# Patient Record
Sex: Female | Born: 1935 | Race: White | Hispanic: No | Marital: Married | Smoking: Never smoker
Health system: Southern US, Community
[De-identification: ages and names within clinical notes are randomized; demographics above are authoritative.]

## PROBLEM LIST (undated history)

## (undated) DIAGNOSIS — M545 Low back pain, unspecified: Secondary | ICD-10-CM

## (undated) DIAGNOSIS — E119 Type 2 diabetes mellitus without complications: Secondary | ICD-10-CM

## (undated) DIAGNOSIS — H3530 Unspecified macular degeneration: Secondary | ICD-10-CM

## (undated) DIAGNOSIS — I10 Essential (primary) hypertension: Secondary | ICD-10-CM

## (undated) DIAGNOSIS — M1990 Unspecified osteoarthritis, unspecified site: Secondary | ICD-10-CM

## (undated) DIAGNOSIS — R0989 Other specified symptoms and signs involving the circulatory and respiratory systems: Secondary | ICD-10-CM

## (undated) DIAGNOSIS — G8929 Other chronic pain: Secondary | ICD-10-CM

## (undated) HISTORY — DX: Low back pain: M54.5

## (undated) HISTORY — DX: Other specified symptoms and signs involving the circulatory and respiratory systems: R09.89

## (undated) HISTORY — DX: Low back pain, unspecified: M54.50

## (undated) HISTORY — DX: Unspecified macular degeneration: H35.30

## (undated) HISTORY — DX: Unspecified osteoarthritis, unspecified site: M19.90

## (undated) HISTORY — DX: Essential (primary) hypertension: I10

## (undated) HISTORY — DX: Other chronic pain: G89.29

## (undated) HISTORY — DX: Type 2 diabetes mellitus without complications: E11.9

---

## 2004-05-19 ENCOUNTER — Ambulatory Visit: Payer: Self-pay | Admitting: Family Medicine

## 2004-08-25 ENCOUNTER — Ambulatory Visit: Payer: Self-pay | Admitting: Family Medicine

## 2004-09-22 ENCOUNTER — Ambulatory Visit: Payer: Self-pay | Admitting: Family Medicine

## 2004-10-28 ENCOUNTER — Ambulatory Visit: Payer: Self-pay | Admitting: Family Medicine

## 2005-02-10 ENCOUNTER — Ambulatory Visit: Payer: Self-pay | Admitting: Family Medicine

## 2005-05-18 ENCOUNTER — Ambulatory Visit: Payer: Self-pay | Admitting: Family Medicine

## 2005-07-13 ENCOUNTER — Ambulatory Visit: Payer: Self-pay | Admitting: Family Medicine

## 2005-10-18 ENCOUNTER — Ambulatory Visit: Payer: Self-pay | Admitting: Family Medicine

## 2005-11-25 ENCOUNTER — Ambulatory Visit: Payer: Self-pay | Admitting: Family Medicine

## 2005-12-07 ENCOUNTER — Ambulatory Visit: Payer: Self-pay | Admitting: Family Medicine

## 2006-01-20 ENCOUNTER — Ambulatory Visit: Payer: Self-pay | Admitting: Family Medicine

## 2006-01-27 ENCOUNTER — Ambulatory Visit: Payer: Self-pay | Admitting: Family Medicine

## 2006-02-05 ENCOUNTER — Encounter: Admission: RE | Admit: 2006-02-05 | Discharge: 2006-02-05 | Payer: Self-pay | Admitting: Orthopaedic Surgery

## 2006-02-21 ENCOUNTER — Encounter: Admission: RE | Admit: 2006-02-21 | Discharge: 2006-03-08 | Payer: Self-pay | Admitting: Orthopaedic Surgery

## 2006-03-22 ENCOUNTER — Encounter: Admission: RE | Admit: 2006-03-22 | Discharge: 2006-03-22 | Payer: Self-pay | Admitting: Orthopaedic Surgery

## 2006-04-13 ENCOUNTER — Encounter: Admission: RE | Admit: 2006-04-13 | Discharge: 2006-04-13 | Payer: Self-pay | Admitting: Orthopaedic Surgery

## 2006-05-04 ENCOUNTER — Ambulatory Visit: Payer: Self-pay | Admitting: Family Medicine

## 2006-05-24 ENCOUNTER — Encounter: Admission: RE | Admit: 2006-05-24 | Discharge: 2006-05-24 | Payer: Self-pay | Admitting: Orthopaedic Surgery

## 2006-07-21 ENCOUNTER — Encounter: Admission: RE | Admit: 2006-07-21 | Discharge: 2006-07-21 | Payer: Self-pay | Admitting: Neurosurgery

## 2008-12-23 ENCOUNTER — Encounter: Admission: RE | Admit: 2008-12-23 | Discharge: 2008-12-23 | Payer: Self-pay | Admitting: Cardiology

## 2008-12-25 ENCOUNTER — Ambulatory Visit: Payer: Self-pay | Admitting: Vascular Surgery

## 2009-04-30 ENCOUNTER — Encounter: Admission: RE | Admit: 2009-04-30 | Discharge: 2009-04-30 | Payer: Self-pay | Admitting: Otolaryngology

## 2009-12-22 ENCOUNTER — Ambulatory Visit: Payer: Self-pay | Admitting: Cardiology

## 2010-02-15 ENCOUNTER — Encounter: Payer: Self-pay | Admitting: Orthopaedic Surgery

## 2010-02-15 ENCOUNTER — Encounter: Payer: Self-pay | Admitting: Otolaryngology

## 2010-05-07 ENCOUNTER — Other Ambulatory Visit: Payer: Self-pay | Admitting: Otolaryngology

## 2010-05-07 DIAGNOSIS — E041 Nontoxic single thyroid nodule: Secondary | ICD-10-CM

## 2010-05-11 ENCOUNTER — Ambulatory Visit
Admission: RE | Admit: 2010-05-11 | Discharge: 2010-05-11 | Disposition: A | Discharge status: Home / Self Care | Payer: Medicare Other | Source: Ambulatory Visit | Attending: Otolaryngology | Admitting: Otolaryngology

## 2010-05-11 DIAGNOSIS — E041 Nontoxic single thyroid nodule: Secondary | ICD-10-CM

## 2010-06-09 NOTE — Procedures (Signed)
CAROTID DUPLEX EXAM   INDICATION:  Right bruit.   HISTORY:  Diabetes:  no  Cardiac:  no  Hypertension:  yes  Smoking:  no  Previous Surgery:  no  CV History:  Asymptomatic  Amaurosis Fugax No, Paresthesias No, Hemiparesis No                                       RIGHT             LEFT  Brachial systolic pressure:         150               144  Brachial Doppler waveforms:         triphasic         triphasic  Vertebral direction of flow:        Antegrade         Antegrade  DUPLEX VELOCITIES (cm/sec)  CCA peak systolic                   77                71  ECA peak systolic                   82                59  ICA peak systolic                   126               115  ICA end diastolic                   40                30  PLAQUE MORPHOLOGY:                  calcified         mixed  PLAQUE AMOUNT:                      Moderate          Moderate  PLAQUE LOCATION:                    Bifurcation, ICA, ECA               Bifurcation, ICA, ECA   IMPRESSION:  1. 40 to 59% stenosis noted in the bilateral internal carotid artery.  2. Mass noted at the level of the right proximal common carotid artery      measuring 1.12 x 1.38 cm.   ___________________________________________  Janetta Hora. Fields, MD   CJ/MEDQ  D:  12/25/2008  T:  12/25/2008  Job:  [id number]

## 2010-07-20 ENCOUNTER — Encounter: Payer: Self-pay | Admitting: Cardiology

## 2010-07-31 ENCOUNTER — Encounter: Payer: Self-pay | Admitting: Cardiology

## 2011-01-20 ENCOUNTER — Other Ambulatory Visit: Payer: Self-pay | Admitting: Cardiology

## 2011-05-26 ENCOUNTER — Encounter: Payer: Self-pay | Admitting: *Deleted

## 2013-05-22 ENCOUNTER — Inpatient Hospital Stay (HOSPITAL_COMMUNITY)
Admission: EM | Admit: 2013-05-22 | Discharge: 2013-05-25 | DRG: 871 | Disposition: A | Discharge status: Hospice | Payer: Medicare Other | Attending: Internal Medicine | Admitting: Internal Medicine

## 2013-05-22 ENCOUNTER — Emergency Department (HOSPITAL_COMMUNITY): Payer: Medicare Other

## 2013-05-22 ENCOUNTER — Encounter (HOSPITAL_COMMUNITY): Payer: Self-pay | Admitting: Emergency Medicine

## 2013-05-22 DIAGNOSIS — J918 Pleural effusion in other conditions classified elsewhere: Secondary | ICD-10-CM

## 2013-05-22 DIAGNOSIS — R0989 Other specified symptoms and signs involving the circulatory and respiratory systems: Secondary | ICD-10-CM | POA: Diagnosis present

## 2013-05-22 DIAGNOSIS — A419 Sepsis, unspecified organism: Principal | ICD-10-CM | POA: Diagnosis present

## 2013-05-22 DIAGNOSIS — Z515 Encounter for palliative care: Secondary | ICD-10-CM

## 2013-05-22 DIAGNOSIS — M1990 Unspecified osteoarthritis, unspecified site: Secondary | ICD-10-CM | POA: Diagnosis present

## 2013-05-22 DIAGNOSIS — M545 Low back pain, unspecified: Secondary | ICD-10-CM | POA: Diagnosis present

## 2013-05-22 DIAGNOSIS — I10 Essential (primary) hypertension: Secondary | ICD-10-CM | POA: Diagnosis present

## 2013-05-22 DIAGNOSIS — R6520 Severe sepsis without septic shock: Secondary | ICD-10-CM

## 2013-05-22 DIAGNOSIS — Z7982 Long term (current) use of aspirin: Secondary | ICD-10-CM

## 2013-05-22 DIAGNOSIS — J90 Pleural effusion, not elsewhere classified: Secondary | ICD-10-CM | POA: Diagnosis present

## 2013-05-22 DIAGNOSIS — IMO0002 Reserved for concepts with insufficient information to code with codable children: Secondary | ICD-10-CM

## 2013-05-22 DIAGNOSIS — H3530 Unspecified macular degeneration: Secondary | ICD-10-CM | POA: Diagnosis present

## 2013-05-22 DIAGNOSIS — R4182 Altered mental status, unspecified: Secondary | ICD-10-CM

## 2013-05-22 DIAGNOSIS — W19XXXA Unspecified fall, initial encounter: Secondary | ICD-10-CM | POA: Diagnosis present

## 2013-05-22 DIAGNOSIS — G8929 Other chronic pain: Secondary | ICD-10-CM | POA: Diagnosis present

## 2013-05-22 DIAGNOSIS — Z66 Do not resuscitate: Secondary | ICD-10-CM | POA: Diagnosis present

## 2013-05-22 DIAGNOSIS — R0600 Dyspnea, unspecified: Secondary | ICD-10-CM

## 2013-05-22 DIAGNOSIS — E119 Type 2 diabetes mellitus without complications: Secondary | ICD-10-CM | POA: Diagnosis present

## 2013-05-22 DIAGNOSIS — F411 Generalized anxiety disorder: Secondary | ICD-10-CM | POA: Diagnosis present

## 2013-05-22 DIAGNOSIS — J189 Pneumonia, unspecified organism: Secondary | ICD-10-CM | POA: Diagnosis present

## 2013-05-22 LAB — CBC WITH DIFFERENTIAL/PLATELET
BASOS PCT: 0 % (ref 0–1)
Basophils Absolute: 0 10*3/uL (ref 0.0–0.1)
EOS PCT: 0 % (ref 0–5)
Eosinophils Absolute: 0 10*3/uL (ref 0.0–0.7)
HCT: 31.9 % — ABNORMAL LOW (ref 36.0–46.0)
HEMOGLOBIN: 10.6 g/dL — AB (ref 12.0–15.0)
LYMPHS PCT: 3 % — AB (ref 12–46)
Lymphs Abs: 0.6 10*3/uL — ABNORMAL LOW (ref 0.7–4.0)
MCH: 30.3 pg (ref 26.0–34.0)
MCHC: 33.2 g/dL (ref 30.0–36.0)
MCV: 91.1 fL (ref 78.0–100.0)
MONO ABS: 1.6 10*3/uL — AB (ref 0.1–1.0)
Monocytes Relative: 7 % (ref 3–12)
NEUTROS PCT: 90 % — AB (ref 43–77)
Neutro Abs: 19.3 10*3/uL — ABNORMAL HIGH (ref 1.7–7.7)
Platelets: 213 10*3/uL (ref 150–400)
RBC: 3.5 MIL/uL — ABNORMAL LOW (ref 3.87–5.11)
RDW: 12.4 % (ref 11.5–15.5)
WBC: 21.5 10*3/uL — AB (ref 4.0–10.5)

## 2013-05-22 LAB — CBG MONITORING, ED: GLUCOSE-CAPILLARY: 406 mg/dL — AB (ref 70–99)

## 2013-05-22 LAB — I-STAT CG4 LACTIC ACID, ED: LACTIC ACID, VENOUS: 1.19 mmol/L (ref 0.5–2.2)

## 2013-05-22 MED ORDER — SODIUM CHLORIDE 0.9 % IV SOLN
1000.0000 mL | Freq: Once | INTRAVENOUS | Status: AC
  Administered 2013-05-22: 1000 mL via INTRAVENOUS

## 2013-05-22 MED ORDER — ACETAMINOPHEN 650 MG RE SUPP
650.0000 mg | Freq: Once | RECTAL | Status: AC
  Administered 2013-05-22: 650 mg via RECTAL
  Filled 2013-05-22: qty 1

## 2013-05-22 MED ORDER — SODIUM CHLORIDE 0.9 % IV SOLN: 1000.0000 mL | INTRAVENOUS | Status: DC

## 2013-05-22 MED ORDER — VANCOMYCIN HCL IN DEXTROSE 1-5 GM/200ML-% IV SOLN
1000.0000 mg | Freq: Once | INTRAVENOUS | Status: AC
  Administered 2013-05-22: 1000 mg via INTRAVENOUS
  Filled 2013-05-22: qty 200

## 2013-05-22 MED ORDER — DEXTROSE 5 % IV SOLN
2.0000 g | Freq: Once | INTRAVENOUS | Status: AC
  Administered 2013-05-22: 2 g via INTRAVENOUS

## 2013-05-22 NOTE — ED Notes (Signed)
Per ems, pt from [facility] [place]. Diabetic, has been seeing PCP for increased blood sugar, was given medication but sugar still high. Pt tachycardic and tachypnic, also has had fever for last couple of days. Per ems, pt given night meds for sleep. Pt is snoring and arousable but just mumbles a few words. Unsure of normal baseline. Pt feels hot.

## 2013-05-22 NOTE — ED Notes (Signed)
Family reports that pt fell on Friday at assisted living and has not been the same since. Pt had reported L rib cage was sore, and family sts breathing was raspy today.

## 2013-05-22 NOTE — ED Notes (Signed)
Family reports that facility administered sleeping medication pta.

## 2013-05-22 NOTE — ED Notes (Signed)
Family at bedside. States when she takes her meds she is unarousable. Pt family states she fell Friday and was not seen for it. Family states shes had in and out confusion since Friday.

## 2013-05-22 NOTE — ED Provider Notes (Signed)
CSN: [id number]     Arrival date & time 05/22/13  2255 History   First MD Initiated Contact with Patient 05/22/13 2258     Chief Complaint  Patient presents with  . Hyperglycemia  . Fever     (Consider location/radiation/quality/duration/timing/severity/associated sxs/prior Treatment) HPI Comments: 80 year old female with macular degeneration, high blood pressure, diabetes presents with confusion and fever. History of present illness is provided by the daughters and nursing staff from ER and nursing home. Patient had a fall on Friday landing on her left flank. Since then she has had gradually worsening confusion and developed a fever today. Family feels she is always confused and sleepy after taking her sleeping meds for which he took prior to arrival. The fever is new. Patient has had a mild congestion and cough starting yesterday. No current antibiotics. Patient is a DO NOT RESUSCITATE. Nothing has improved his symptoms.  Patient is a 80 y.o. female presenting with hyperglycemia and fever. The history is provided by a relative and medical records.  Hyperglycemia Associated symptoms: fever   Fever   Past Medical History  Diagnosis Date  . DM (diabetes mellitus)   . HTN (hypertension)   . Macular degeneration   . Right carotid bruit   . Osteoarthritis   . Chronic low back pain    History reviewed. No pertinent past surgical history. Family History  Problem Relation Age of Onset  . Heart attack     History  Substance Use Topics  . Smoking status: Never Smoker   . Smokeless tobacco: Not on file  . Alcohol Use: No   OB History   Grav Para Term Preterm Abortions TAB SAB Ect Mult Living                 Review of Systems  Unable to perform ROS: Mental status change  Constitutional: Positive for fever.      Allergies  Review of patient's allergies indicates not on file.  Home Medications   Prior to Admission medications   Medication Sig Start Date End Date Taking?  Authorizing Provider  amLODipine (NORVASC) 10 MG tablet Take 10 mg by mouth daily.    Historical Provider, MD  aspirin 81 MG tablet Take 81 mg by mouth daily.    Historical Provider, MD  hydrochlorothiazide (HYDRODIURIL) 25 MG tablet Take 25 mg by mouth daily.    Historical Provider, MD  irbesartan (AVAPRO) 300 MG tablet Take 300 mg by mouth at bedtime.    Historical Provider, MD  lovastatin (MEVACOR) 40 MG tablet Take 40 mg by mouth at bedtime.    Historical Provider, MD  metFORMIN (GLUCOPHAGE) 500 MG tablet Take 500 mg by mouth daily with breakfast.    Historical Provider, MD  saxagliptin HCl (ONGLYZA) 5 MG TABS tablet Take by mouth daily.    Historical Provider, MD   BP 128/47  Pulse 102  Temp(Src) 101.6 F (38.7 C) (Rectal)  Resp 30  SpO2 99% Physical Exam  Nursing note and vitals reviewed. Constitutional: She appears well-developed.  HENT:  Head: Normocephalic and atraumatic.  Dry mucous membranes  Eyes: Conjunctivae are normal. Right eye exhibits no discharge. Left eye exhibits no discharge.  Neck: Normal range of motion. Neck supple. No tracheal deviation present.  Cardiovascular: Regular rhythm.  Tachycardia present.   Pulmonary/Chest: Respiratory distress: tachypnea. She has rales (rales at bases and minimal respiratory sounds on left lung.).  Abdominal: Soft. She exhibits no distension. There is no tenderness. There is no guarding.  Musculoskeletal: She  exhibits no edema.  Neurological: GCS eye subscore is 3. GCS verbal subscore is 4. GCS motor subscore is 4.  Lethargic patient moves extremities with weakness equal bilateral with stimulus. Neck supple. Pupils equal bilateral. Unable to understand verbal.  Skin: Skin is warm. No rash noted. There is pallor.    ED Course  Procedures (including critical care time) CRITICAL CARE Performed by: Enid SkeensJoshua M Early Ord   Total critical care time: 40 min  Critical care time was exclusive of separately billable procedures and  treating other patients.  Critical care was necessary to treat or prevent imminent or life-threatening deterioration.  Critical care was time spent personally by me on the following activities: development of treatment plan with patient and/or surrogate as well as nursing, discussions with consultants, evaluation of patient's response to treatment, examination of patient, obtaining history from patient or surrogate, ordering and performing treatments and interventions, ordering and review of laboratory studies, ordering and review of radiographic studies, pulse oximetry and re-evaluation of patient's condition.  Labs Review Labs Reviewed  BASIC METABOLIC PANEL - Abnormal; Notable for the following:    Sodium 128 (*)    Chloride 94 (*)    Glucose, Bld 402 (*)    BUN 35 (*)    Creatinine, Ser 1.37 (*)    Calcium 8.2 (*)    GFR calc non Af Amer 33 (*)    GFR calc Af Amer 38 (*)    All other components within normal limits  CBC WITH DIFFERENTIAL - Abnormal; Notable for the following:    WBC 21.5 (*)    RBC 3.50 (*)    Hemoglobin 10.6 (*)    HCT 31.9 (*)    Neutrophils Relative % 90 (*)    Neutro Abs 19.3 (*)    Lymphocytes Relative 3 (*)    Lymphs Abs 0.6 (*)    Monocytes Absolute 1.6 (*)    All other components within normal limits  URINALYSIS, ROUTINE W REFLEX MICROSCOPIC - Abnormal; Notable for the following:    Glucose, UA >1000 (*)    Hgb urine dipstick TRACE (*)    Ketones, ur 15 (*)    Protein, ur 100 (*)    All other components within normal limits  URINE MICROSCOPIC-ADD ON - Abnormal; Notable for the following:    Casts HYALINE CASTS (*)    All other components within normal limits  CBG MONITORING, ED - Abnormal; Notable for the following:    Glucose-Capillary 406 (*)    All other components within normal limits  URINE CULTURE  CULTURE, BLOOD (ROUTINE X 2)  CULTURE, BLOOD (ROUTINE X 2)  I-STAT CG4 LACTIC ACID, ED    Imaging Review Ct Head Wo Contrast  05/23/2013    CLINICAL DATA:  Mental status change, history of diabetes, also patient is tachypneic and tachycardic  EXAM: CT HEAD WITHOUT CONTRAST  TECHNIQUE: Contiguous axial images were obtained from the base of the skull through the vertex without intravenous contrast.  COMPARISON:  None.  FINDINGS: There is mild diffuse cerebral and cerebellar atrophy with compensatory ventriculomegaly. There is decreased density in the deep white matter of both cerebral hemispheres consistent with chronic small vessel ischemic type change. 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 There is no evidence of an acute intracranial hemorrhage nor of an evolving ischemic infarction. The cerebellum and brainstem are normal in density.  At bone window settings the observed portions of the paranasal sinuses and mastoid air cells  are clear. There is no evidence of an acute skull fracture.  IMPRESSION: There is no acute ischemic or hemorrhagic event within the brain. There are findings consistent with chronic small vessel ischemia. There is no intracranial mass effect or hydrocephalus.   Electronically Signed   By: David  [place]   On: 05/23/2013 00:34   Ct Chest Wo Contrast  05/23/2013   CLINICAL DATA:  New abnormal opacification of much of the left hemi thorax  EXAM: CT CHEST WITHOUT CONTRAST  TECHNIQUE: Multidetector CT imaging of the chest was performed following the standard protocol without IV contrast.  COMPARISON:  DG CHEST 1V PORT dated 05/22/2013  FINDINGS: There is a large left-sided pleural effusion which appears to be in part loculated. It exhibits Hounsfield measurement of +18. There is atelectatic lung adjacent to the effusion. There is a small amount of aerated left upper lobe remaining. There is shift of the mediastinum towards the right. There is a tiny right pleural effusion and there is right basilar atelectasis. The right upper and middle lobes are grossly clear. The cardiac chambers are  mildly enlarged. The caliber of the thoracic aorta is normal. There are coronary artery calcifications present.  There is mild loss of height along the superior endplate of T6. There is compression of the body of T11 with loss of height of at least 60% anteriorly. There is no definite retropulsion of bone. The observed portions of the ribs exhibit no acute abnormalities.  Within the upper abdomen the observed portions of the liver and spleen and adrenal glands appear normal.  IMPRESSION: 1. The new opacification of the left hemi thorax appears to be largely due to loculated pleural fluid on the left. There is also atelectatic lung. There is shift of the mediastinum toward the right. A discrete mass is not demonstrated but the noncontrast technique is insensitive in this regard. 2. There is a small right pleural effusion layering posteriorly and there is adjacent atelectasis. 3. There is mild enlargement of the cardiac chambers without pulmonary vascular congestion. 4. There are compression fractures of T6 and T11 with T11 exhibiting loss of height of at least 60% anteriorly.   Electronically Signed   By: David  [place]   On: 05/23/2013 01:22   Dg Chest Port 1 View  05/22/2013   CLINICAL DATA:  Left-sided chest pain, status post fall 1 week ago, now with shortness of breath.  EXAM: PORTABLE CHEST - 1 VIEW  COMPARISON:  DG CHEST 2V dated 11/10/2011  FINDINGS: Since the previous study there has developed marked volume loss on the left with a large amount of soft tissue density present. In there is mild shift of the mediastinum toward the right but there is pre-existing dextroscoliosis. The right lung is adequately inflated and clear. The cardiac silhouette is obscured. The observed portions of the left ribs appear intact. No acute bony abnormality elsewhere is demonstrated.  IMPRESSION: Since the previous study in of October 2013 there is developed volume loss on the left with only a small amount of remaining aerated  lung. This may reflect the presence of a large new he pneumothorax or pleural effusion. Certainly an underlying mass is not excluded. Chest CT scanning now is recommended.  These results were called by telephone at the time of interpretation on 05/22/2013 at 11:56 PM .   Electronically Signed   By: David  [place]   On: 05/22/2013 23:57     EKG Interpretation None      MDM   Final  diagnoses:  HCAP (healthcare-associated pneumonia)  Dyspnea  Altered mental state  Severe sepsis  Pleural effusion, left  Fall   Concern for sepsis due to temperature, tachycardia, tachypnea and nursing home. Clarified DO NOT RESUSCITATE with the family. Code sepsis called and brought antibiotics and fluid bolus.  Patient improved with 2 L fluid bolus and antipyretics. Patient still confused on rechecks. Chest x-ray showed pleural effusion recommend CT for further delineation. CT showed significant left pleural effusion with compression fracture. No pneumothorax seen.  With sepsis presentation concern for infectious cause. Patient has also had fall recently. CT head no acute findings. Urine unremarkable. Spoke with [facility] hospitalist for stepped-down admission. Spoke with family and updated on plan. Spoke with pulmonology on call for consult regarding effusion.  The patients results and plan were reviewed and discussed.   Any x-rays performed were personally reviewed by myself.   Differential diagnosis were considered with the presenting HPI.   Filed Vitals:   05/23/13 0114 05/23/13 0145 05/23/13 0200 05/23/13 0215  BP: 131/62 110/48 119/57 116/50  Pulse: 95 90 91 92  Temp: 99.3 F (37.4 C)     TempSrc: Rectal     Resp: 21 19 16 19   SpO2: 90% 96% 96% 95%    Admission/ observation were discussed with the admitting physician, patient and/or family and they are comfortable with the plan.    Enid Skeens, MD 05/23/13 [phone]

## 2013-05-23 ENCOUNTER — Emergency Department (HOSPITAL_COMMUNITY): Payer: Medicare Other

## 2013-05-23 ENCOUNTER — Encounter (HOSPITAL_COMMUNITY): Payer: Self-pay | Admitting: Internal Medicine

## 2013-05-23 DIAGNOSIS — J189 Pneumonia, unspecified organism: Secondary | ICD-10-CM | POA: Diagnosis present

## 2013-05-23 DIAGNOSIS — J90 Pleural effusion, not elsewhere classified: Secondary | ICD-10-CM

## 2013-05-23 DIAGNOSIS — J918 Pleural effusion in other conditions classified elsewhere: Secondary | ICD-10-CM

## 2013-05-23 DIAGNOSIS — R4182 Altered mental status, unspecified: Secondary | ICD-10-CM

## 2013-05-23 DIAGNOSIS — R6520 Severe sepsis without septic shock: Secondary | ICD-10-CM

## 2013-05-23 DIAGNOSIS — Z66 Do not resuscitate: Secondary | ICD-10-CM

## 2013-05-23 DIAGNOSIS — Z515 Encounter for palliative care: Secondary | ICD-10-CM

## 2013-05-23 DIAGNOSIS — R0989 Other specified symptoms and signs involving the circulatory and respiratory systems: Secondary | ICD-10-CM

## 2013-05-23 DIAGNOSIS — A419 Sepsis, unspecified organism: Secondary | ICD-10-CM | POA: Diagnosis present

## 2013-05-23 DIAGNOSIS — R0609 Other forms of dyspnea: Secondary | ICD-10-CM

## 2013-05-23 DIAGNOSIS — E119 Type 2 diabetes mellitus without complications: Secondary | ICD-10-CM | POA: Diagnosis present

## 2013-05-23 LAB — URINALYSIS, ROUTINE W REFLEX MICROSCOPIC
BILIRUBIN URINE: NEGATIVE
KETONES UR: 15 mg/dL — AB
Leukocytes, UA: NEGATIVE
NITRITE: NEGATIVE
Protein, ur: 100 mg/dL — AB
SPECIFIC GRAVITY, URINE: 1.024 (ref 1.005–1.030)
UROBILINOGEN UA: 1 mg/dL (ref 0.0–1.0)
pH: 6.5 (ref 5.0–8.0)

## 2013-05-23 LAB — URINE MICROSCOPIC-ADD ON

## 2013-05-23 LAB — BASIC METABOLIC PANEL
BUN: 35 mg/dL — AB (ref 6–23)
CO2: 22 mEq/L (ref 19–32)
CREATININE: 1.37 mg/dL — AB (ref 0.50–1.10)
Calcium: 8.2 mg/dL — ABNORMAL LOW (ref 8.4–10.5)
Chloride: 94 mEq/L — ABNORMAL LOW (ref 96–112)
GFR calc Af Amer: 38 mL/min — ABNORMAL LOW (ref >90)
GFR, EST NON AFRICAN AMERICAN: 33 mL/min — AB (ref >90)
GLUCOSE: 402 mg/dL — AB (ref 70–99)
POTASSIUM: 4.9 meq/L (ref 3.7–5.3)
SODIUM: 128 meq/L — AB (ref 137–147)

## 2013-05-23 LAB — URINE CULTURE
Colony Count: NO GROWTH
Culture: NO GROWTH

## 2013-05-23 MED ORDER — ALPRAZOLAM 0.25 MG PO TABS: 0.2500 mg | ORAL_TABLET | Freq: Three times a day (TID) | ORAL | Status: DC | PRN

## 2013-05-23 MED ORDER — POLYVINYL ALCOHOL 1.4 % OP SOLN
1.0000 [drp] | Freq: Four times a day (QID) | OPHTHALMIC | Status: DC
  Administered 2013-05-23: 1 [drp] via OPHTHALMIC
  Filled 2013-05-23: qty 15

## 2013-05-23 MED ORDER — TEMAZEPAM 15 MG PO CAPS
30.0000 mg | ORAL_CAPSULE | Freq: Every day | ORAL | Status: DC
  Administered 2013-05-23 – 2013-05-24 (×2): 30 mg via ORAL
  Filled 2013-05-23 (×2): qty 2

## 2013-05-23 MED ORDER — GABAPENTIN 300 MG PO CAPS
300.0000 mg | ORAL_CAPSULE | Freq: Every day | ORAL | Status: DC
  Administered 2013-05-23 – 2013-05-24 (×2): 300 mg via ORAL
  Filled 2013-05-23 (×4): qty 1

## 2013-05-23 MED ORDER — GABAPENTIN 100 MG PO CAPS
100.0000 mg | ORAL_CAPSULE | Freq: Every evening | ORAL | Status: DC | PRN
  Filled 2013-05-23: qty 1

## 2013-05-23 MED ORDER — HEPARIN SODIUM (PORCINE) 5000 UNIT/ML IJ SOLN: 5000.0000 [IU] | Freq: Three times a day (TID) | INTRAMUSCULAR | Status: DC

## 2013-05-23 MED ORDER — AMLODIPINE BESYLATE 5 MG PO TABS
5.0000 mg | ORAL_TABLET | Freq: Every day | ORAL | Status: DC
  Filled 2013-05-23 (×3): qty 1

## 2013-05-23 MED ORDER — SODIUM CHLORIDE 0.9 % IV SOLN: INTRAVENOUS | Status: DC

## 2013-05-23 MED ORDER — MORPHINE SULFATE (CONCENTRATE) 10 MG /0.5 ML PO SOLN
5.0000 mg | ORAL | Status: DC | PRN
  Administered 2013-05-23 – 2013-05-25 (×3): 5 mg via ORAL
  Filled 2013-05-23 (×3): qty 0.5

## 2013-05-23 MED ORDER — BIOTENE DRY MOUTH MT LIQD
15.0000 mL | Freq: Two times a day (BID) | OROMUCOSAL | Status: DC
  Administered 2013-05-23 – 2013-05-24 (×4): 15 mL via OROMUCOSAL

## 2013-05-23 MED ORDER — ACETAMINOPHEN 500 MG PO TABS
1000.0000 mg | ORAL_TABLET | Freq: Four times a day (QID) | ORAL | Status: DC | PRN
  Filled 2013-05-23 (×2): qty 2

## 2013-05-23 MED ORDER — FLUTICASONE PROPIONATE 50 MCG/ACT NA SUSP
1.0000 | Freq: Every day | NASAL | Status: DC
  Filled 2013-05-23: qty 16

## 2013-05-23 MED ORDER — HYDROCHLOROTHIAZIDE 25 MG PO TABS: 25.0000 mg | ORAL_TABLET | Freq: Every day | ORAL | Status: DC | PRN

## 2013-05-23 MED ORDER — DOCUSATE SODIUM 100 MG PO CAPS: 100.0000 mg | ORAL_CAPSULE | Freq: Two times a day (BID) | ORAL | Status: DC | PRN

## 2013-05-23 MED ORDER — GABAPENTIN 100 MG PO CAPS
200.0000 mg | ORAL_CAPSULE | ORAL | Status: DC
  Administered 2013-05-23 – 2013-05-24 (×4): 200 mg via ORAL
  Filled 2013-05-23 (×7): qty 2

## 2013-05-23 MED ORDER — ALPRAZOLAM 0.25 MG PO TABS
0.2500 mg | ORAL_TABLET | Freq: Every evening | ORAL | Status: DC | PRN
  Administered 2013-05-23: 0.25 mg via ORAL
  Filled 2013-05-23: qty 1

## 2013-05-23 MED ORDER — ASPIRIN 81 MG PO CHEW
81.0000 mg | CHEWABLE_TABLET | Freq: Every day | ORAL | Status: DC
  Filled 2013-05-23 (×3): qty 1

## 2013-05-23 MED ORDER — INSULIN ASPART 100 UNIT/ML ~~LOC~~ SOLN: 0.0000 [IU] | SUBCUTANEOUS | Status: DC

## 2013-05-23 MED ORDER — CHLORHEXIDINE GLUCONATE 0.12 % MT SOLN
15.0000 mL | Freq: Two times a day (BID) | OROMUCOSAL | Status: DC
  Administered 2013-05-23 – 2013-05-25 (×4): 15 mL via OROMUCOSAL
  Filled 2013-05-23 (×4): qty 15

## 2013-05-23 MED ORDER — SIMVASTATIN 20 MG PO TABS
20.0000 mg | ORAL_TABLET | Freq: Every day | ORAL | Status: DC
  Filled 2013-05-23: qty 1

## 2013-05-23 MED ORDER — ERYTHROMYCIN 5 MG/GM OP OINT
1.0000 "application " | TOPICAL_OINTMENT | Freq: Every day | OPHTHALMIC | Status: DC
  Filled 2013-05-23 (×8): qty 3.5

## 2013-05-23 MED ORDER — MELOXICAM 7.5 MG PO TABS
7.5000 mg | ORAL_TABLET | Freq: Two times a day (BID) | ORAL | Status: DC
  Administered 2013-05-23 – 2013-05-24 (×2): 7.5 mg via ORAL
  Filled 2013-05-23 (×6): qty 1

## 2013-05-23 MED ORDER — VANCOMYCIN HCL IN DEXTROSE 750-5 MG/150ML-% IV SOLN: 750.0000 mg | INTRAVENOUS | Status: DC

## 2013-05-23 MED ORDER — DEXTROSE 5 % IV SOLN: 1.0000 g | INTRAVENOUS | Status: DC

## 2013-05-23 NOTE — Consult Note (Addendum)
Patient Faith Walker      DOB: 1921/08/06      QQV:[id number]     Consult Note from the Palliative Medicine Team at [facility] Requested by: Dr. Alcario Drought   PCP: Dr. Dione Housekeeper Reason for Consultation: [place]   Phone [phone] Related symptom management Assessment of patients Current state:  80 yr old white female with past medical history for diabetes, possible TIA vs stroke last year with residual left sided weakness, and anxiety with sleep disturbance for which she takes temazepam.  Patient has been in [place] since November and gets around with a rolling walker.  Last week she had a fall with left sided rib pain followed by report of abnormal blood sugars for which her oral medications were increased and a urine specimen was sent to Dr. Murrell Redden office.  Dip on urine was normal but culture is pending.  Patient was sent to the ER yesterday when fever increased to 104 degrees and the patient was more confused.  In the ED, patient found to have pneumonia with a complex loculated left-sided parapneumonic effusion. Patient and family have declined chest tube placement after discussing this with pulmonary in the emergency room. I met with the patient's daughter she Faith Walker with the patient. At this time she went that they feel that their mother is confused and anxious about the current situation and they would prefer not to speak directly to her about their previous conversations which are to move on to hospice care. Encouraged him to allow [place] to speak with her later in the morning regarding her current condition if she desires. The patient herself stated to me that she did not want further information about why she is in the hospital she just wanted to sleep. Her daughter stated that they would not withhold information  from her but at this time felt it would make her more anxious especially since she is confused intermittently. I asked if I could return later in the morning  to assure that the patient is appropriately included in the decision making process if possible. I have updated the patient's murmur hospitalist and the social worker on seeking possible hospice home placement.   Goals of Care: 1.  Code Status: DO NOT RESUSCITATE   2. Scope of Treatment: Family at this time does not descend and continuing antibiotics if they're not going to drain the pleural effusion.  It would continue medications are for comfort for her which at this time are her Mobic and her Neurontin as well as her Xanax, which we will liberalize.     4. Disposition: Family desires transition to hospice facility   3. Symptom Management:   1. Anxiety/Agitation: Change to Xanax to 0.25 3 times a day when necessary 2. Pain: Will add Roxanol 5 mg sublingual every 2 hours when necessary for pain or dyspnea. Here in the hospital dosing will be a concentration of 10 milligrams per 0.5 mL. Dosing for discharge to be Roxanol 20 mg per mL 5 mg sublingual every 2 hours when necessary dispense 30 mL 3. Bowel Regimen: Monitor 4. Delirium: High risk continue to monitor and reorient 5. Fever: Tylenol as needed 6. Chronic pain continue Neurontin and Mobic as long as patient is able to take by mouth medications 7. Diabetes ok to not check BS and not continue oral medications as a comfort measure.  4. Psychosocial: Patient was a housewife. She lost her husband 3 years ago. She has 2 daughters  and is described as a very independent and feisty woman. Patient lost her husband to advanced COPD and cared for him under hospice at home.  5. Spiritual: Family aware of spiritual counseling availability   Patient Documents Completed or Given: Document Given Completed  Advanced Directives Pkt    MOST    DNR    Gone from My Sight    Hard Choices      Brief HPI: Patient is a 80 year old white female with a known past medical history diabetes. She developed fever after a fall over the weekend and is now  found to have a large loculated complex parapneumonic effusion. Family and patient have declined chest tube placement. Patient has been talking with her daughters over the weekend about her decline in health and her desire to be comfortable and not seek further intervention. We have been asked to assist with goals of care. Family is requesting hospice facility placement  ROS: Denies chest pain, states she needs to get back to sleep and she's a little bit anxious. She denies feeling shortness of breath and at this time has reasonable work of breathing    PMH:  Past Medical History  Diagnosis Date  . DM (diabetes mellitus)   . HTN (hypertension)   . Macular degeneration   . Right carotid bruit   . Osteoarthritis   . Chronic low back pain      [id number] reviewed. No pertinent past surgical history. I have reviewed the [facility] and [facility] and  If appropriate update it with new information. No Known Allergies Scheduled Meds: . amLODipine  5 mg Oral Daily  . antiseptic oral rinse  15 mL Mouth Rinse q12n4p  . aspirin  81 mg Oral Daily  . chlorhexidine  15 mL Mouth Rinse BID  . erythromycin  1 application Both Eyes QHS  . fluticasone  1 spray Each Nare QHS  . gabapentin  200 mg Oral 2 times per day  . gabapentin  300 mg Oral QHS  . meloxicam  7.5 mg Oral BID  . polyvinyl alcohol  1 drop Both Eyes QID  . simvastatin  20 mg Oral q1800  . temazepam  30 mg Oral QHS   Continuous Infusions:  PRN Meds:.acetaminophen, ALPRAZolam, docusate sodium, gabapentin    BP 111/49  Pulse 88  Temp(Src) 99.3 F (37.4 C) (Rectal)  Resp 19  Ht 5' 3" (1.6 m)  Wt 62.3 kg (137 lb 5.6 oz)  BMI 24.34 kg/m2  SpO2 95%   PPS: 30-40%   Intake/Output Summary (Last 24 hours) at 05/23/13 0854 Last data filed at 05/23/13 0981  Gross per 24 hour  Intake      0 ml  Output      0 ml  Net      0 ml   LBM: PTA  Physical Exam:  General: Awa a prior to admissionke alert oriented to self and location state time  in the hospital, not able to go much beyond that regarding time and events. She is perseverating about getting back to sleep stating she took her pill and she needs to go to sleep. HEENT:  Normocephalic, atraumatic pupils equal round and reactive to light extraocular muscles appear to be intact mucous membranes are moist neck is supple there is no JVD, I do not appreciate any tracheal deviation at least externally.  Chest: Decreased right base to three quarters, the apex with some breath sounds, left completely decreased throughout  CVS: Irregularly irregular, S1 and S2 no S3  or S4 I do not appreciate a murmur rub or gallop Abdomen: Soft nontender nondistended with positive bowel sounds Ext: Warm with trace edema of lower extremities Neuro: Awake oriented to self and place but not time or events, anxious  Labs: CBC    Component Value Date/Time   WBC 21.5* 05/22/2013 2317   RBC 3.50* 05/22/2013 2317   HGB 10.6* 05/22/2013 2317   HCT 31.9* 05/22/2013 2317   PLT 213 05/22/2013 2317   MCV 91.1 05/22/2013 2317   MCH 30.3 05/22/2013 2317   MCHC 33.2 05/22/2013 2317   RDW 12.4 05/22/2013 2317   LYMPHSABS 0.6* 05/22/2013 2317   MONOABS 1.6* 05/22/2013 2317   EOSABS 0.0 05/22/2013 2317   BASOSABS 0.0 05/22/2013 2317   CMP     Component Value Date/Time   NA 128* 05/22/2013 2317   K 4.9 05/22/2013 2317   CL 94* 05/22/2013 2317   CO2 22 05/22/2013 2317   GLUCOSE 402* 05/22/2013 2317   BUN 35* 05/22/2013 2317   CREATININE 1.37* 05/22/2013 2317   CALCIUM 8.2* 05/22/2013 2317   GFRNONAA 33* 05/22/2013 2317   GFRAA 38* 05/22/2013 2317    Chest Xray Reviewed/Impressions:  Since the previous study in of October 2013 there is developed  volume loss on the left with only a small amount of remaining  aerated lung. This may reflect the presence of a large new he  pneumothorax or pleural effusion. Certainly an underlying mass is  not excluded. Chest CT scanning now is recommended   CT scan of the Head  Reviewed/Impressions:  There is no acute ischemic or hemorrhagic event within the brain.  There are findings consistent with chronic small vessel ischemia.  There is no intracranial mass effect or hydrocephalus.    CT chest:. The new opacification of the left hemi thorax appears to be  largely due to loculated pleural fluid on the left. There is also  atelectatic lung. There is shift of the mediastinum toward the  right. A discrete mass is not demonstrated but the noncontrast  technique is insensitive in this regard.  2. There is a small right pleural effusion layering posteriorly and  there is adjacent atelectasis.  3. There is mild enlargement of the cardiac chambers without  pulmonary vascular congestion.  4. There are compression fractures of T6 and T11 with T11 exhibiting  loss of height of at least 60% anteriorly.   Time In Time Out Total Time Spent with Patient Total Overall Time  800 am  835 am 15 min  35 min    Greater than 50%  of this time was spent counseling and coordinating care related to the above assessment and plan.   Tory Mckissack L. Lovena Le, MD MBA The Palliative Medicine Team at [facility] Phone: [phone] Pager: [phone]

## 2013-05-23 NOTE — Consult Note (Signed)
[facility] Liaison: Received request from CS[facility] Chart reviewed. Will update CSW re availability in am. Understand from CSW Dr. Ladona Ridgelaylor to meet with family again this afternoon. Thank you. Forrestine Himva Kashawn Manzano LCSW [phone]

## 2013-05-23 NOTE — Progress Notes (Signed)
Janna Archssie B Gobert [id number] Code Status: DNR  Janna Archssie B Scheaffer is a 80 y.o. female patient admitted to 6N15 from ED.  Patient is asleep/lethargic and responds physically to stimuli, but no verbalization observed.  IV in place, occlusive dsg intact without redness. Orientation to room, and floor completed with daughters at bedside.  Admission INP armband ID verified with patient/family, and in place.  SR up x 2, fall assessment complete. Call light within reach.  Skin is clean, dry, intact.   Upon arrival to the floor, family verbalized that they wanted comfort measures only for their mother.  Noted orders for CBG checks, IVF, IV Abx, Heparin, Telemetry--all of which family refused.  Notified Dr. Julian ReilGardner who is supportive of their wishes and d/c'd these orders.  Patient is currently sleeping comfortably in bed on 2L O2 via nasal cannula with no signs of acute distress. Per family she had her "sleep medicine" earlier. Currently providing frequent oral care and repositioning to keep patient comfortable. Family eager to meet with palliative care later today.  Burt Ekaylor D Cook, RN 05/23/2013 4:55 AM

## 2013-05-23 NOTE — Progress Notes (Signed)
ANTIBIOTIC CONSULT NOTE - INITIAL  Pharmacy Consult for Vancomycin/Cefepime  Indication: HCAP, loculated pleural effusion   No Known Allergies  Patient Measurements: Weight: 137 lb 5.6 oz (62.3 kg) (per RN)  Vital Signs: Temp: 99.3 F (37.4 C) (04/29 0114) Temp src: Rectal (04/29 0114) BP: 111/49 mmHg (04/29 0245) Pulse Rate: 88 (04/29 0245)  Labs:  Recent Labs  05/22/13 2317  WBC 21.5*  HGB 10.6*  PLT 213  CREATININE 1.37*   Medical History: Past Medical History  Diagnosis Date  . DM (diabetes mellitus)   . HTN (hypertension)   . Macular degeneration   . Right carotid bruit   . Osteoarthritis   . Chronic low back pain    Assessment: 80 y/o F from nursing home with AMS/shortness of breath found to have large loculated pleural effusion. To continue broad spectrum antibiotics for now, but main focus is on comfort. WBC 21.5, Scr 1.37, other labs as above.   Goal of Therapy:  Vancomycin trough level 15-20 mcg/ml  Plan:  -Vancomycin 750 mg IV q24h -Cefepime 1g q24h -Trend WBC, temp, renal function -Drug levels as indicated   Abran DukeJames Greggory Safranek 05/23/2013,3:26 AM

## 2013-05-23 NOTE — Clinical Social Work Note (Signed)
CSW received consult for residential hospice placement. CSW spoke with pt's daughters regarding discharge disposition. CSW offered emotional support to pt's daughters. CSW provided pt's daughters with residential hospice placement choice. Pt's [facility] CSW has made referrals to both facilities.   CSW to continue to follow and assist with discharge.  Darlyn ChamberEmily [place], LCSWA Clinical Social Worker [phone]

## 2013-05-23 NOTE — ED Notes (Signed)
Pt o2 stats dropped to 89%. Placed on 2L [place].

## 2013-05-23 NOTE — ED Notes (Signed)
Pt remains in CT

## 2013-05-23 NOTE — Consult Note (Signed)
Asked by ER physician to assess 80 yo female from nursing home with fever and altered mental status.  She was found to have large left loculated pleural effusion.  In discussion with family it was revealed that patient has undergone progressive decline in her health for the past one year to the point she was no longer able to care for herself.  The pt had expressed to her family that she did not want to have medical interventions, and that she was prepared to die.    Discussed with family CT findings, and that conservative therapy with supplemental oxygen and antibiotics likely would be ineffective.  Also explained risks of chest tube drainage or surgical intervention for her loculated effusion.  Family was in agreement that she would not want to have any of these types of interventions.  They requested instead to focus on keeping patient comfortable, and they understand that the pt will not survive this illness.  DNR order placed.  ER physician will consult [facility] to admit pt to floor bed for palliative/comfort measures.  D/w family option of residential hospice depending on how long the dying process takes.  Will defer further management to [facility].  Please call if further assistance is required from PCCM.  Coralyn HellingVineet Estefan Pattison, MD [facility] 05/23/2013, 2:26 AM Pager:  [phone] After 3pm call: [phone]

## 2013-05-23 NOTE — H&P (Signed)
[facility] History and Physical  Faith Walker ZOX:[id number] DOB: 12/27/1921 DOA: 05/22/2013  Referring physician: EDP PCP: No primary provider on file.   Chief Complaint: SOB, AMS   HPI: Faith Walker is a 80 y.o. female who presents to the ED with confusion and fever.  HPI provided by daughters.  Per daughters she has had gradually worsening confusion and developed fever today.  All of this seems to have started with a fall on Friday.  Mild congestion and cough starting yesterday.  Per daughters she is always confused like she is now after taking sleeping meds which she did PTA.  Work up in ED demonstrates new opacification of the L hemithorax on CXR.  CT chest shows this to be due to a large loculated pleural effusion on the left.  Review of Systems: Unable to perform due to AMS.  Past Medical History  Diagnosis Date  . DM (diabetes mellitus)   . HTN (hypertension)   . Macular degeneration   . Right carotid bruit   . Osteoarthritis   . Chronic low back pain    History reviewed. No pertinent past surgical history. Social History:  reports that she has never smoked. She does not have any smokeless tobacco history on file. She reports that she does not drink alcohol. Her drug history is not on file.  No Known Allergies  Family History  Problem Relation Age of Onset  . Heart attack       Prior to Admission medications   Medication Sig Start Date End Date Taking? Authorizing Provider  acetaminophen (TYLENOL) 500 MG tablet Take 1,000 mg by mouth every 6 (six) hours as needed for headache.   Yes Historical Provider, MD  ALPRAZolam Prudy Feeler(XANAX) 0.5 MG tablet Take 0.25 mg by mouth at bedtime as needed for anxiety.   Yes Historical Provider, MD  amLODipine (NORVASC) 5 MG tablet Take 5 mg by mouth daily.   Yes Historical Provider, MD  aspirin 81 MG chewable tablet Chew by mouth daily.   Yes Historical Provider, MD  cyanocobalamin (,VITAMIN B-12,) 1000 MCG/ML injection Inject  1,000 mcg into the muscle every 30 (thirty) days.   Yes Historical Provider, MD  diphenhydrAMINE (BENADRYL) 25 mg capsule Take 25 mg by mouth 2 (two) times daily.   Yes Historical Provider, MD  docusate sodium (COLACE) 100 MG capsule Take 100 mg by mouth 2 (two) times daily as needed for mild constipation.   Yes Historical Provider, MD  erythromycin ophthalmic ointment Place 1 application into both eyes at bedtime.   Yes Historical Provider, MD  fluticasone (FLONASE) 50 MCG/ACT nasal spray Place 1 spray into both nostrils at bedtime.   Yes Historical Provider, MD  gabapentin (NEURONTIN) 100 MG capsule Take 100 mg by mouth at bedtime as needed (for pain).   Yes Historical Provider, MD  gabapentin (NEURONTIN) 100 MG capsule Take 200-300 mg by mouth 3 (three) times daily. Take 2 capsules every morning and at noon then take 3 capsules at bedtime   Yes Historical Provider, MD  glipiZIDE (GLUCOTROL XL) 2.5 MG 24 hr tablet Take 2.5 mg by mouth daily with breakfast. May give extra tablet by mouth if Blood Sugar is greater than 250   Yes Historical Provider, MD  glipiZIDE (GLUCOTROL XL) 5 MG 24 hr tablet Take 5 mg by mouth 2 (two) times daily.   Yes Historical Provider, MD  hydrochlorothiazide (HYDRODIURIL) 25 MG tablet Take 25 mg by mouth daily as needed (for fluid).    Yes  Historical Provider, MD  lovastatin (MEVACOR) 40 MG tablet Take 40 mg by mouth daily.    Yes Historical Provider, MD  meloxicam (MOBIC) 7.5 MG tablet Take 7.5 mg by mouth 2 (two) times daily.   Yes Historical Provider, MD  Multiple Vitamin (MULTIVITAMIN WITH MINERALS) TABS tablet Take 1 tablet by mouth daily.   Yes Historical Provider, MD  Polyethyl Glycol-Propyl Glycol (SYSTANE) 0.4-0.3 % SOLN Place 1 drop into both eyes 4 (four) times daily.   Yes Historical Provider, MD  temazepam (RESTORIL) 30 MG capsule Take 30 mg by mouth at bedtime.   Yes Historical Provider, MD  Vitamin D, Ergocalciferol, (DRISDOL) 50000 UNITS CAPS capsule Take  50,000 Units by mouth every 7 (seven) days. On saturdays   Yes Historical Provider, MD   Physical Exam: Filed Vitals:   05/23/13 0215  BP: 116/50  Pulse: 92  Temp:   Resp: 19    BP 116/50  Pulse 92  Temp(Src) 99.3 F (37.4 C) (Rectal)  Resp 19  SpO2 95%  General Appearance:    Asleep, lethargic, no distress, appears stated age  Head:    Normocephalic, atraumatic  Eyes:    PERRL, EOMI, sclera non-icteric        Nose:   Nares without drainage or epistaxis. Mucosa, turbinates normal  Throat:   Dry mucous membranes. Oropharynx without erythema or exudate.  Neck:   Supple. No carotid bruits.  No thyromegaly.  No lymphadenopathy.   Back:     No CVA tenderness, no spinal tenderness  Lungs:     Clear to auscultation bilaterally, without wheezes, rhonchi or rales  Chest wall:    No tenderness to palpitation  Heart:    Regular rate and rhythm without murmurs, gallops, rubs  Abdomen:     Soft, non-tender, nondistended, normal bowel sounds, no organomegaly  Genitalia:    deferred  Rectal:    deferred  Extremities:   No clubbing, cyanosis or edema.  Pulses:   2+ and symmetric all extremities  Skin:   Skin color, texture, turgor normal, no rashes or lesions  Lymph nodes:   Cervical, supraclavicular, and axillary nodes normal  Neurologic:   Lethargic, MAE, unable to understand verbal    Labs on Admission:  Basic Metabolic Panel:  Recent Labs Lab 05/22/13 2317  NA 128*  K 4.9  CL 94*  CO2 22  GLUCOSE 402*  BUN 35*  CREATININE 1.37*  CALCIUM 8.2*   Liver Function Tests: No results found for this basename: AST, ALT, ALKPHOS, BILITOT, PROT, ALBUMIN,  in the last 168 hours No results found for this basename: LIPASE, AMYLASE,  in the last 168 hours No results found for this basename: AMMONIA,  in the last 168 hours CBC:  Recent Labs Lab 05/22/13 2317  WBC 21.5*  NEUTROABS 19.3*  HGB 10.6*  HCT 31.9*  MCV 91.1  PLT 213   Cardiac Enzymes: No results found for this  basename: CKTOTAL, CKMB, CKMBINDEX, TROPONINI,  in the last 168 hours  BNP (last 3 results) No results found for this basename: PROBNP,  in the last 8760 hours CBG:  Recent Labs Lab 05/22/13 2257  GLUCAP 406*    Radiological Exams on Admission: Ct Head Wo Contrast  05/23/2013   CLINICAL DATA:  Mental status change, history of diabetes, also patient is tachypneic and tachycardic  EXAM: CT HEAD WITHOUT CONTRAST  TECHNIQUE: Contiguous axial images were obtained from the base of the skull through the vertex without intravenous contrast.  COMPARISON:  None.  FINDINGS: There is mild diffuse cerebral and cerebellar atrophy with compensatory ventriculomegaly. There is decreased density in the deep white matter of both cerebral hemispheres consistent with chronic small vessel ischemic type change. 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 There is no evidence of an acute intracranial hemorrhage nor of an evolving ischemic infarction. The cerebellum and brainstem are normal in density.  At bone window settings the observed portions of the paranasal sinuses and mastoid air cells are clear. There is no evidence of an acute skull fracture.  IMPRESSION: There is no acute ischemic or hemorrhagic event within the brain. There are findings consistent with chronic small vessel ischemia. There is no intracranial mass effect or hydrocephalus.   Electronically Signed   By: David  [place]   On: 05/23/2013 00:34   Ct Chest Wo Contrast  05/23/2013   CLINICAL DATA:  New abnormal opacification of much of the left hemi thorax  EXAM: CT CHEST WITHOUT CONTRAST  TECHNIQUE: Multidetector CT imaging of the chest was performed following the standard protocol without IV contrast.  COMPARISON:  DG CHEST 1V PORT dated 05/22/2013  FINDINGS: There is a large left-sided pleural effusion which appears to be in part loculated. It exhibits Hounsfield measurement of +18. There is atelectatic lung adjacent  to the effusion. There is a small amount of aerated left upper lobe remaining. There is shift of the mediastinum towards the right. There is a tiny right pleural effusion and there is right basilar atelectasis. The right upper and middle lobes are grossly clear. The cardiac chambers are mildly enlarged. The caliber of the thoracic aorta is normal. There are coronary artery calcifications present.  There is mild loss of height along the superior endplate of T6. There is compression of the body of T11 with loss of height of at least 60% anteriorly. There is no definite retropulsion of bone. The observed portions of the ribs exhibit no acute abnormalities.  Within the upper abdomen the observed portions of the liver and spleen and adrenal glands appear normal.  IMPRESSION: 1. The new opacification of the left hemi thorax appears to be largely due to loculated pleural fluid on the left. There is also atelectatic lung. There is shift of the mediastinum toward the right. A discrete mass is not demonstrated but the noncontrast technique is insensitive in this regard. 2. There is a small right pleural effusion layering posteriorly and there is adjacent atelectasis. 3. There is mild enlargement of the cardiac chambers without pulmonary vascular congestion. 4. There are compression fractures of T6 and T11 with T11 exhibiting loss of height of at least 60% anteriorly.   Electronically Signed   By: David  [place]   On: 05/23/2013 01:22   Dg Chest Port 1 View  05/22/2013   CLINICAL DATA:  Left-sided chest pain, status post fall 1 week ago, now with shortness of breath.  EXAM: PORTABLE CHEST - 1 VIEW  COMPARISON:  DG CHEST 2V dated 11/10/2011  FINDINGS: Since the previous study there has developed marked volume loss on the left with a large amount of soft tissue density present. In there is mild shift of the mediastinum toward the right but there is pre-existing dextroscoliosis. The right lung is adequately inflated and clear.  The cardiac silhouette is obscured. The observed portions of the left ribs appear intact. No acute bony abnormality elsewhere is demonstrated.  IMPRESSION: Since the previous study in of October 2013 there is developed volume loss  on the left with only a small amount of remaining aerated lung. This may reflect the presence of a large new he pneumothorax or pleural effusion. Certainly an underlying mass is not excluded. Chest CT scanning now is recommended.  These results were called by telephone at the time of interpretation on 05/22/2013 at 11:56 PM .   Electronically Signed   By: David  [place]   On: 05/22/2013 23:57    EKG: Independently reviewed.  Assessment/Plan Principal Problem:   Parapneumonic effusion Active Problems:   DM2 (diabetes mellitus, type 2)   Sepsis   HCAP (healthcare-associated pneumonia)   DNR (do not resuscitate)   1. Parapneumonic effusion and HCAP resulting in sepsis- while treatment was initiated with vanc and cefepime, due to the size of the effusion, it is felt by myself and pulmonology that ABx alone will likely be ineffective.  After discussion with pulmonology, family reveals that patient has undergone progressive decline in health for past year to the point where she was no longer able to care for herself and patient had even expressed to her family that she did not want to have medical interventions and was prepared to die.  As a result of the patients expressed wishes, family has decided to decline the offered chest tube with the understanding that patient will not survive this illness.  Will admit to med-surg, focus on comfort measures, have left the ABX on for now but likely expect that these (admitedly futile) treatments will be stopped tomorrow after palliative care meets with family.  Anticipate residential hospice depending on how long patient survives.  Will also continue IVF hydration, but patient has made clear to family previously "no feeding tube" as  well. 2. DM2 - holding amaryl and putting patient on q4h SSI for now as i suspect her PO intake will be diminished.    Code Status: DNR/DNI, Do not feeding tube, goals of care = patient comfort  Family Communication: Daughters at bedside Disposition Plan: Admit to inpatient   Time spent: 39 min  Hillary Bow [facility] Pager [phone]  If 7AM-7PM, please contact the day team taking care of the patient [email or web address] Password [facility] 05/23/2013, 2:24 AM

## 2013-05-23 NOTE — Progress Notes (Signed)
Same day note  H and P from earlier this AM reviewed.   Background: Faith Walker is a 80 y.o. female who presents to the ED with confusion and fever. HPI provided by daughters. Per daughters she has had gradually worsening confusion and developed fever today. All of this seems to have started with a fall on Friday. Mild congestion and cough starting yesterday. Per daughters she is always confused like she is now after taking sleeping meds which she did PTA.  Work up in ED demonstrates new opacification of the L hemithorax on CXR. CT chest shows this to be due to a large loculated pleural effusion on the left.  Agree with assessment and plan as outlined. Pt presents with gradual decline with parapneumonic effusion and hcap with sepsis. Case was discussed with Crit Care overnight and was felt by admitting team that abx alone would be ineffective. Pt and family has since agreed upon full comfort measures only. Appreciate input by Palliative Care. Will follow.

## 2013-05-23 NOTE — Significant Event (Signed)
Contacted by RN, at this point Family wishes to proceed to full comfort measures only as they feel (correctly) that our current treatment is "only prolonging the inevitable".   I agree that family's decision is reasonable.  DCing CBG checks, DC antibiotics, DC IVF, DC heparin, DC HCTZ, suspect other home meds will not be given due to patient AMS.  Palliative care consult later today.

## 2013-05-24 DIAGNOSIS — T148XXA Other injury of unspecified body region, initial encounter: Secondary | ICD-10-CM

## 2013-05-24 MED ORDER — DIPHENHYDRAMINE HCL 50 MG/ML IJ SOLN: 25.0000 mg | Freq: Four times a day (QID) | INTRAMUSCULAR | Status: DC | PRN

## 2013-05-24 MED ORDER — SCOPOLAMINE 1 MG/3DAYS TD PT72
1.0000 | MEDICATED_PATCH | TRANSDERMAL | Status: DC
  Administered 2013-05-24: 1.5 mg via TRANSDERMAL
  Filled 2013-05-24: qty 1

## 2013-05-24 NOTE — Consult Note (Signed)
[facility] Liaison: Continue to follow for family interest in [facility]. Unfortunately [facility] availability today or tomorrow. Will continue to follow, update CSW if availability changes for this patient. Thank you. Forrestine Himva Allisha Harter LCSW [phone]

## 2013-05-24 NOTE — Progress Notes (Signed)
[facility] PROGRESS NOTE  Faith Walker:[id number] DOB: August 07, 1921 DOA: 05/22/2013 PCP: Josue Hector, MD  Assessment/Plan: 1. Parapneumonic effusion and HCAP resulting in sepsis 1. Full comfort measures currently. 2. Greatly appreciate Palliative Care input 3. Awaiting placement to [facility] 2. DM2 - holding diabetes meds and finger sticks per comfort care 3. HTN 4. Chronic pain 1. Pain med recs noted by Palliative Care noted and appreciated  Code Status: DNR/Comfort Family Communication: Pt in room (indicate person spoken with, relationship, and if by phone, the number) Disposition Plan: Pending    Consultants:  Palliative Care  HPI/Subjective: No acute events noted  Objective: Filed Vitals:   05/23/13 0245 05/23/13 0325 05/23/13 1300 05/24/13 0601  BP: 111/49  137/51 107/70  Pulse: 88  96 77  Temp:   98.2 F (36.8 C) 100.6 F (38.1 C)  TempSrc:   Oral Axillary  Resp: 19  20 24   Height:  5\' 3"  (1.6 m)    Weight:  62.3 kg (137 lb 5.6 oz)    SpO2: 95%  97% 90%    Intake/Output Summary (Last 24 hours) at 05/24/13 1009 Last data filed at 05/24/13 0700  Gross per 24 hour  Intake    180 ml  Output    175 ml  Net      5 ml   Filed Weights   05/23/13 0325  Weight: 62.3 kg (137 lb 5.6 oz)    Exam:   General:  Asleep, arousable  Cardiovascular: regular, perfused  Respiratory: normal resp effort, no audible wheezing  Abdomen: nondistended  Musculoskeletal: perfused, no clubbing   Data Reviewed: Basic Metabolic Panel:  Recent Labs Lab 05/22/13 2317  NA 128*  K 4.9  CL 94*  CO2 22  GLUCOSE 402*  BUN 35*  CREATININE 1.37*  CALCIUM 8.2*   Liver Function Tests: No results found for this basename: AST, ALT, ALKPHOS, BILITOT, PROT, ALBUMIN,  in the last 168 hours No results found for this basename: LIPASE, AMYLASE,  in the last 168 hours No results found for this basename: AMMONIA,  in the last 168 hours CBC:  Recent  Labs Lab 05/22/13 2317  WBC 21.5*  NEUTROABS 19.3*  HGB 10.6*  HCT 31.9*  MCV 91.1  PLT 213   Cardiac Enzymes: No results found for this basename: CKTOTAL, CKMB, CKMBINDEX, TROPONINI,  in the last 168 hours BNP (last 3 results) No results found for this basename: PROBNP,  in the last 8760 hours CBG:  Recent Labs Lab 05/22/13 2257  GLUCAP 406*    Recent Results (from the past 240 hour(s))  CULTURE, BLOOD (ROUTINE X 2)     Status: None   Collection Time    05/22/13 11:17 PM      Result Value Ref Range Status   Specimen Description BLOOD RIGHT ANTECUBITAL   Final   Special Requests     Final   Value: BOTTLES DRAWN AEROBIC AND ANAEROBIC AER 10CC ANA 9CC   Culture  Setup Time     Final   Value: 05/23/2013 03:32     Performed at [facility]   Culture     Final   Value:        BLOOD CULTURE RECEIVED NO GROWTH TO DATE CULTURE WILL BE HELD FOR 5 DAYS BEFORE ISSUING A FINAL NEGATIVE REPORT     Performed at [facility]   Report Status PENDING   Incomplete  CULTURE, BLOOD (ROUTINE X 2)     Status: None  Collection Time    05/22/13 11:27 PM      Result Value Ref Range Status   Specimen Description BLOOD ARM LEFT   Final   Special Requests BOTTLES DRAWN AEROBIC AND ANAEROBIC 10CC   Final   Culture  Setup Time     Final   Value: 05/23/2013 03:18     [facility]formed at [facility]   Culture     Final   Value:        BLOOD CULTURE RECEIVED NO GROWTH TO DATE CULTURE WILL BE HELD FOR 5 DAYS BEFORE ISSUING A FINAL NEGATIVE REPORT     Performed at [facility]   Report Status PENDING   Incomplete  URINE CULTURE     Status: None   Collection Time    05/22/13 11:42 PM      Result Value Ref Range Status   Specimen Description URINE, CATHETERIZED   Final   Special Requests NONE   Final   Culture  Setup Time     Final   Value: 05/23/2013 03:46     Performed at [facility]   Colony Count     Final   Value: NO GROWTH     [facility]   Culture     Final   Value: NO GROWTH     Performed at [facility]   Report Status 05/23/2013 FINAL   Final     Studies: Ct Head Wo Contrast  05/23/2013   CLINICAL DATA:  Mental status change, history of diabetes, also patient is tachypneic and tachycardic  EXAM: CT HEAD WITHOUT CONTRAST  TECHNIQUE: Contiguous axial images were obtained from the base of the skull through the vertex without intravenous contrast.  COMPARISON:  None.  FINDINGS: There is mild diffuse cerebral and cerebellar atrophy with compensatory ventriculomegaly. There is decreased density in the deep white matter of both cerebral hemispheres consistent with chronic small vessel ischemic type change. 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 There is no evidence of an acute intracranial hemorrhage nor of an evolving ischemic infarction. The cerebellum and brainstem are normal in density.  At bone window settings the observed portions of the paranasal sinuses and mastoid air cells are clear. There is no evidence of an acute skull fracture.  IMPRESSION: There is no acute ischemic or hemorrhagic event within the brain. There are findings consistent with chronic small vessel ischemia. There is no intracranial mass effect or hydrocephalus.   Electronically Signed   By: David  [place]   On: 05/23/2013 00:34   Ct Chest Wo Contrast  05/23/2013   CLINICAL DATA:  New abnormal opacification of much of the left hemi thorax  EXAM: CT CHEST WITHOUT CONTRAST  TECHNIQUE: Multidetector CT imaging of the chest was performed following the standard protocol without IV contrast.  COMPARISON:  DG CHEST 1V PORT dated 05/22/2013  FINDINGS: There is a large left-sided pleural effusion which appears to be in part loculated. It exhibits Hounsfield measurement of +18. There is atelectatic lung adjacent to the effusion. There is a small amount of aerated left upper lobe remaining. There is shift of the  mediastinum towards the right. There is a tiny right pleural effusion and there is right basilar atelectasis. The right upper and middle lobes are grossly clear. The cardiac chambers are mildly enlarged. The caliber of the thoracic aorta is normal. There are coronary artery calcifications present.  There is mild loss  of height along the superior endplate of T6. There is compression of the body of T11 with loss of height of at least 60% anteriorly. There is no definite retropulsion of bone. The observed portions of the ribs exhibit no acute abnormalities.  Within the upper abdomen the observed portions of the liver and spleen and adrenal glands appear normal.  IMPRESSION: 1. The new opacification of the left hemi thorax appears to be largely due to loculated pleural fluid on the left. There is also atelectatic lung. There is shift of the mediastinum toward the right. A discrete mass is not demonstrated but the noncontrast technique is insensitive in this regard. 2. There is a small right pleural effusion layering posteriorly and there is adjacent atelectasis. 3. There is mild enlargement of the cardiac chambers without pulmonary vascular congestion. 4. There are compression fractures of T6 and T11 with T11 exhibiting loss of height of at least 60% anteriorly.   Electronically Signed   By: David  [place]   On: 05/23/2013 01:22   Dg Chest Port 1 View  05/22/2013   CLINICAL DATA:  Left-sided chest pain, status post fall 1 week ago, now with shortness of breath.  EXAM: PORTABLE CHEST - 1 VIEW  COMPARISON:  DG CHEST 2V dated 11/10/2011  FINDINGS: Since the previous study there has developed marked volume loss on the left with a large amount of soft tissue density present. In there is mild shift of the mediastinum toward the right but there is pre-existing dextroscoliosis. The right lung is adequately inflated and clear. The cardiac silhouette is obscured. The observed portions of the left ribs appear intact. No acute  bony abnormality elsewhere is demonstrated.  IMPRESSION: Since the previous study in of October 2013 there is developed volume loss on the left with only a small amount of remaining aerated lung. This may reflect the presence of a large new he pneumothorax or pleural effusion. Certainly an underlying mass is not excluded. Chest CT scanning now is recommended.  These results were called by telephone at the time of interpretation on 05/22/2013 at 11:56 PM .   Electronically Signed   By: David  [place]   On: 05/22/2013 23:57    Scheduled Meds: . amLODipine  5 mg Oral Daily  . antiseptic oral rinse  15 mL Mouth Rinse q12n4p  . aspirin  81 mg Oral Daily  . chlorhexidine  15 mL Mouth Rinse BID  . erythromycin  1 application Both Eyes QHS  . fluticasone  1 spray Each Nare QHS  . gabapentin  200 mg Oral 2 times per day  . gabapentin  300 mg Oral QHS  . meloxicam  7.5 mg Oral BID  . polyvinyl alcohol  1 drop Both Eyes QID  . temazepam  30 mg Oral QHS   Continuous Infusions:   Principal Problem:   Parapneumonic effusion Active Problems:   DM2 (diabetes mellitus, type 2)   Sepsis   HCAP (healthcare-associated pneumonia)   DNR (do not resuscitate)  Time spent: 35min  Jerald KiefStephen K Aryka Coonradt  [facility] Pager [phone]. If 7PM-7AM, please contact night-coverage at [email or web address], password [facility] 05/24/2013, 10:09 AM  LOS: 2 days

## 2013-05-24 NOTE — Progress Notes (Signed)
Patient ZO:XWRUE:Verba B Arrambide      DOB: 11/02/1921      AVW:[id number]   [facility]    Subjective: Ronell remains able to communicate with her family.  She is noted to be talking with them more about end of life issuses.  She reports no pain or shortness of breath, but is noted to have a little bit of a hitch in her breathing and increased oral secretions compared to yesterday.  She does not look like she is in distress and she was able to tell her daughters to leave her be to take a rest for a while. Noted fever over night.   Filed Vitals:   05/24/13 1041  BP: 121/51  Pulse:   Temp:   Resp:    Physical exam:    General: no acute distress, weak but asserting her independence, eyes closed but speech soft and clear PERRL, EOMI, slightly distant look, mm m Chest decreased with some coarse upper airway crackles CVS: regular irregular, S1, S2 WGN:FAOZAbd:soft, not tender or distended Ext: warm, not mottled Neuro: oriented to herself and her family, sharp wit despite circumstances    Assessment and plan: 80 yr old white female with large parapneumonic effusion, sepsis and pneumonia. Family has elected comfort care and awaiting word on hospice home opening.  Patient used a little roxanol yesterday but has not needed any today and has access to her xanax as well.  1.  DNR  2.  Comfort feeding what ever she wishes  3. Pain controlled with little need for meds. Continue Roxanol  4.  Lg parapneumonic effusion very mild respiratory disturbance, no significant distress yet.  Continue prn xanax and prn Roxanol.  Will add scop patch as she is starting to get increased oral secretions.   [id number] am Stoney Karczewski L. Ladona Ridgelaylor, MD MBA The Palliative Medicine Team at [facility] Team Phone: [phone] Pager: [phone]

## 2013-05-25 ENCOUNTER — Encounter (HOSPITAL_COMMUNITY): Payer: Self-pay | Admitting: Internal Medicine

## 2013-05-25 ENCOUNTER — Inpatient Hospital Stay (HOSPITAL_COMMUNITY)
Admission: AD | Admit: 2013-05-25 | Discharge: 2013-06-25 | DRG: 871 | Disposition: E | Source: Ambulatory Visit | Attending: Internal Medicine | Admitting: Internal Medicine

## 2013-05-25 DIAGNOSIS — M545 Low back pain, unspecified: Secondary | ICD-10-CM | POA: Diagnosis present

## 2013-05-25 DIAGNOSIS — G8929 Other chronic pain: Secondary | ICD-10-CM | POA: Diagnosis present

## 2013-05-25 DIAGNOSIS — I10 Essential (primary) hypertension: Secondary | ICD-10-CM | POA: Diagnosis present

## 2013-05-25 DIAGNOSIS — J90 Pleural effusion, not elsewhere classified: Secondary | ICD-10-CM | POA: Diagnosis present

## 2013-05-25 DIAGNOSIS — H3530 Unspecified macular degeneration: Secondary | ICD-10-CM | POA: Diagnosis present

## 2013-05-25 DIAGNOSIS — E119 Type 2 diabetes mellitus without complications: Secondary | ICD-10-CM | POA: Diagnosis present

## 2013-05-25 DIAGNOSIS — Z79899 Other long term (current) drug therapy: Secondary | ICD-10-CM

## 2013-05-25 DIAGNOSIS — J189 Pneumonia, unspecified organism: Secondary | ICD-10-CM | POA: Diagnosis present

## 2013-05-25 DIAGNOSIS — J9600 Acute respiratory failure, unspecified whether with hypoxia or hypercapnia: Secondary | ICD-10-CM | POA: Diagnosis present

## 2013-05-25 DIAGNOSIS — Z66 Do not resuscitate: Secondary | ICD-10-CM | POA: Diagnosis present

## 2013-05-25 DIAGNOSIS — J918 Pleural effusion in other conditions classified elsewhere: Secondary | ICD-10-CM

## 2013-05-25 DIAGNOSIS — A419 Sepsis, unspecified organism: Principal | ICD-10-CM | POA: Diagnosis present

## 2013-05-25 DIAGNOSIS — M129 Arthropathy, unspecified: Secondary | ICD-10-CM | POA: Diagnosis present

## 2013-05-25 DIAGNOSIS — R6520 Severe sepsis without septic shock: Secondary | ICD-10-CM

## 2013-05-25 DIAGNOSIS — Z515 Encounter for palliative care: Secondary | ICD-10-CM

## 2013-05-25 MED ORDER — GABAPENTIN 100 MG PO CAPS
200.0000 mg | ORAL_CAPSULE | ORAL | Status: DC
  Filled 2013-05-25 (×2): qty 2

## 2013-05-25 MED ORDER — SCOPOLAMINE 1 MG/3DAYS TD PT72: 1.0000 | MEDICATED_PATCH | TRANSDERMAL | Status: AC

## 2013-05-25 MED ORDER — ACETAMINOPHEN 650 MG RE SUPP: 650.0000 mg | Freq: Four times a day (QID) | RECTAL | Status: DC | PRN

## 2013-05-25 MED ORDER — ACETAMINOPHEN 650 MG RE SUPP
650.0000 mg | Freq: Four times a day (QID) | RECTAL | Status: DC | PRN
Start: 2013-05-25 — End: 2013-05-25

## 2013-05-25 MED ORDER — BIOTENE DRY MOUTH MT LIQD: 15.0000 mL | Freq: Two times a day (BID) | OROMUCOSAL | Status: DC

## 2013-05-25 MED ORDER — ACETAMINOPHEN 650 MG RE SUPP: 650.0000 mg | Freq: Four times a day (QID) | RECTAL | Status: AC | PRN

## 2013-05-25 MED ORDER — GABAPENTIN 300 MG PO CAPS
300.0000 mg | ORAL_CAPSULE | Freq: Every day | ORAL | Status: DC
  Filled 2013-05-25: qty 1

## 2013-05-25 MED ORDER — POLYVINYL ALCOHOL 1.4 % OP SOLN
1.0000 [drp] | Freq: Four times a day (QID) | OPHTHALMIC | Status: DC
  Filled 2013-05-25: qty 15

## 2013-05-25 MED ORDER — LORAZEPAM 2 MG/ML IJ SOLN: 0.5000 mg | INTRAMUSCULAR | Status: DC | PRN

## 2013-05-25 MED ORDER — CHLORHEXIDINE GLUCONATE 0.12 % MT SOLN: 15.0000 mL | Freq: Two times a day (BID) | OROMUCOSAL | Status: AC

## 2013-05-25 MED ORDER — AMLODIPINE BESYLATE 5 MG PO TABS
5.0000 mg | ORAL_TABLET | Freq: Every day | ORAL | Status: DC
Start: 2013-05-26 — End: 2013-05-26
  Filled 2013-05-25: qty 1

## 2013-05-25 MED ORDER — MORPHINE SULFATE (CONCENTRATE) 10 MG /0.5 ML PO SOLN: 5.0000 mg | ORAL | Status: AC | PRN

## 2013-05-25 MED ORDER — BISACODYL 10 MG RE SUPP: 10.0000 mg | Freq: Every day | RECTAL | Status: DC | PRN

## 2013-05-25 MED ORDER — CHLORHEXIDINE GLUCONATE 0.12 % MT SOLN: 15.0000 mL | Freq: Two times a day (BID) | OROMUCOSAL | Status: DC

## 2013-05-25 MED ORDER — FLUTICASONE PROPIONATE 50 MCG/ACT NA SUSP
1.0000 | Freq: Every day | NASAL | Status: DC
  Filled 2013-05-25: qty 16

## 2013-05-25 MED ORDER — GABAPENTIN 100 MG PO CAPS: 100.0000 mg | ORAL_CAPSULE | Freq: Every evening | ORAL | Status: DC | PRN

## 2013-05-25 MED ORDER — DIPHENHYDRAMINE HCL 50 MG/ML IJ SOLN
25.0000 mg | Freq: Four times a day (QID) | INTRAMUSCULAR | Status: AC | PRN
Start: 2013-05-25 — End: ?

## 2013-05-25 MED ORDER — ERYTHROMYCIN 5 MG/GM OP OINT
1.0000 "application " | TOPICAL_OINTMENT | Freq: Every day | OPHTHALMIC | Status: DC
  Filled 2013-05-25: qty 3.5

## 2013-05-25 MED ORDER — ZOLPIDEM TARTRATE 5 MG PO TABS: 5.0000 mg | ORAL_TABLET | Freq: Every evening | ORAL | Status: DC | PRN

## 2013-05-25 MED ORDER — BIOTENE DRY MOUTH MT LIQD: 15.0000 mL | Freq: Two times a day (BID) | OROMUCOSAL | Status: AC

## 2013-05-25 MED ORDER — TEMAZEPAM 15 MG PO CAPS: 30.0000 mg | ORAL_CAPSULE | Freq: Every day | ORAL | Status: DC

## 2013-05-25 MED ORDER — MORPHINE SULFATE (CONCENTRATE) 10 MG /0.5 ML PO SOLN
5.0000 mg | ORAL | Status: DC | PRN
  Administered 2013-05-25 – 2013-05-26 (×4): 5 mg via ORAL
  Filled 2013-05-25 (×4): qty 0.5

## 2013-05-25 MED ORDER — DIPHENHYDRAMINE HCL 50 MG/ML IJ SOLN: 25.0000 mg | Freq: Four times a day (QID) | INTRAMUSCULAR | Status: DC | PRN

## 2013-05-25 MED ORDER — SCOPOLAMINE 1 MG/3DAYS TD PT72: 1.0000 | MEDICATED_PATCH | TRANSDERMAL | Status: DC

## 2013-05-25 MED ORDER — LORAZEPAM 2 MG/ML IJ SOLN: 0.5000 mg | INTRAMUSCULAR | Status: AC | PRN

## 2013-05-25 NOTE — H&P (Signed)
Patient WU:JWJXB:Faith Walker      DOB: 07/16/1921      JYN:[id number]  Patient accepted for GIP hospice admission.  Dx. Parapneumonic effusion.  Patient seen and examined.  Currently, sleeping more, running fever. Secretions improved with scop.  She appears comfortable for now.  Roxanal x two doses today .  Full History and Physical to follow.   Reily Treloar L. Ladona Ridgelaylor, MD MBA The Palliative Medicine Team at [facility] Team Phone: [phone] Pager: [phone]

## 2013-05-25 NOTE — Progress Notes (Addendum)
Patient GN:FAOZH:Navika B Hargett      DOB: 12/05/1921      YQM:[id number]  Patient more somnolent and tachy today.  Sepsis progressing.  I would favor GIP if Hospice feels she is appropriate and am glad to act as attending.  Daughters ok with this consideration. H and P to follow if accepted to GIP.    Prognosis : hours to days  Jo Booze L. Ladona Ridgelaylor, MD MBA The Palliative Medicine Team at [facility] Team Phone: [phone] Pager: [phone]

## 2013-05-25 NOTE — H&P (Signed)
Palliative Medicine Team at [facility]  History and Physical:    Faith Walker EVO:[id number] DOB: [birth year] DOA: 05/28/2013  Referring physician: Dr. Wyline Copas PCP: Sherrie Mustache, MD   Chief Complaint: shortness of breath, altered mental status   History of Present Illness:   Faith Walker is an 80 y.o. female  Admitted on 4/29 with altered mentation and confusion in the face of fever and some congestion and shortness of breath.  In the ER patient was found to have pneumonia with large parapneumonic effusion. She showed signs of acute renal failure, dehydration, hyponatremia and sepsis.  Her family was offered treatment with antibiotics and a chest tube but they elected full comfort care.  Faith Walker has been accepted for Hospice GIP level of care ,with expected outcome to be a hospital death.  ROS:   Constitutional: Unable to participate due to confusion and somnolence  Past Medical History:   Past Medical History  Diagnosis Date  . DM (diabetes mellitus)   . HTN (hypertension)   . Macular degeneration   . Right carotid bruit   . Osteoarthritis   . Chronic low back pain     Past Surgical History:   No past surgical history on file.  Social History:   History   Social History  . Marital Status: Married    Spouse Name: N/A    Number of Children: N/A  . Years of Education: N/A   Occupational History  . Not on file.   Social History Main Topics  . Smoking status: Never Smoker   . Smokeless tobacco: Not on file  . Alcohol Use: No  . Drug Use: Not on file  . Sexual Activity: Not on file   Other Topics Concern  . Not on file   Social History Narrative  . No narrative on file    Family history:   Family History  Problem Relation Age of Onset  . Heart attack      Allergies   Review of patient's allergies indicates no known allergies.  Current Medications:   Prior to Admission medications   Medication Sig Start Date End Date Taking?  Authorizing Provider  acetaminophen (TYLENOL) 650 MG suppository Place 1 suppository (650 mg total) rectally every 6 (six) hours as needed for fever or mild pain. 06/05/2013   Donne Hazel, MD  ALPRAZolam Duanne Moron) 0.5 MG tablet Take 0.25 mg by mouth at bedtime as needed for anxiety.    Historical Provider, MD  antiseptic oral rinse (BIOTENE) LIQD 15 mLs by Mouth Rinse route 2 times daily at 12 noon and 4 pm. 05/28/2013   Donne Hazel, MD  chlorhexidine (PERIDEX) 0.12 % solution 15 mLs by Mouth Rinse route 2 (two) times daily. 06/19/2013   Donne Hazel, MD  diphenhydrAMINE (BENADRYL) 50 MG/ML injection Inject 0.5 mLs (25 mg total) into the vein every 6 (six) hours as needed for itching, allergies or sleep. 06/06/2013   Donne Hazel, MD  docusate sodium (COLACE) 100 MG capsule Take 100 mg by mouth 2 (two) times daily as needed for mild constipation.    Historical Provider, MD  erythromycin ophthalmic ointment Place 1 application into both eyes at bedtime.    Historical Provider, MD  fluticasone (FLONASE) 50 MCG/ACT nasal spray Place 1 spray into both nostrils at bedtime.    Historical Provider, MD  gabapentin (NEURONTIN) 100 MG capsule Take 100 mg by mouth at bedtime as needed (for pain).    Historical Provider, MD  gabapentin (  NEURONTIN) 100 MG capsule Take 200-300 mg by mouth 3 (three) times daily. Take 2 capsules every morning and at noon then take 3 capsules at bedtime    Historical Provider, MD  LORazepam (ATIVAN) 2 MG/ML injection Inject 0.25 mLs (0.5 mg total) into the vein every 4 (four) hours as needed for anxiety. 06/03/2013   Donne Hazel, MD  Morphine Sulfate (MORPHINE CONCENTRATE) 10 mg / 0.5 ml concentrated solution Take 0.25 mLs (5 mg total) by mouth every 2 (two) hours as needed for severe pain or shortness of breath. 06/16/2013   Donne Hazel, MD  Polyethyl Glycol-Propyl Glycol (SYSTANE) 0.4-0.3 % SOLN Place 1 drop into both eyes 4 (four) times daily.    Historical Provider, MD  scopolamine  (TRANSDERM-SCOP) 1 MG/3DAYS Place 1 patch (1.5 mg total) onto the skin every 3 (three) days. 06/13/2013   Donne Hazel, MD    Physical Exam:   There were no vitals filed for this visit.   Physical Exam: 99.7  tmax 101.7  134/50 P: 103 R 21 Sat 92% Gen: No acute distress. Sleeping soundly, murmurs when I listen to her heart but goes back to sleep Head: Normocephalic, atraumatic. Eyes: not examined Mouth: Oropharynx  dry Chest: Lungs some fine crackles, no wheezing CV: Heart sounds tachy, S1, S2 Abdomen: Soft, nontender, nondistended with normal active bowel sounds no grimace on palpation Extremities: Extremities warm, not mottled Skin: Warm and dry. Neuro: somnolent, will rouse slightly to gentle touch   Data Review:    Labs: Basic Metabolic Panel:  Recent Labs Lab 05/22/13 2317  NA 128*  K 4.9  CL 94*  CO2 22  GLUCOSE 402*  BUN 35*  CREATININE 1.37*  CALCIUM 8.2*    CBC:  Recent Labs Lab 05/22/13 2317  WBC 21.5*  NEUTROABS 19.3*  HGB 10.6*  HCT 31.9*  MCV 91.1  PLT 213     CBG:  Recent Labs Lab 05/22/13 2257  GLUCAP 406*    Radiographic Studies:  CT chest: 1. The new opacification of the left hemi thorax appears to be  largely due to loculated pleural fluid on the left. There is also  atelectatic lung. There is shift of the mediastinum toward the  right. A discrete mass is not demonstrated but the noncontrast  technique is insensitive in this regard.  2. There is a small right pleural effusion layering posteriorly and  there is adjacent atelectasis.  3. There is mild enlargement of the cardiac chambers without  pulmonary vascular congestion.  4. There are compression fractures of T6 and T11 with T11 exhibiting  loss of height of at least 60% anteriorly  Xray:Since the previous study in of October 2013 there is developed  volume loss on the left with only a small amount of remaining  aerated lung. This may reflect the presence of a large new  he  pneumothorax or pleural effusion. Certainly an underlying mass is  not excluded. Chest CT scanning now is recommended  CT head: There is no acute ischemic or hemorrhagic event within the brain.  There are findings consistent with chronic small vessel ischemia.  There is no intracranial mass effect or hydrocephalus.   Assessment/Plan:   Active Problems:   Parapneumonic effusion Sepsis Pneumonia Respiratory Failure Chronic Pain  Family has elected full comfort care Treat dyspnea with Roxanol prn, oxygen Treat pain with Roxanol. If patient able to take her chronic pain meds orally may offer Comfort feeding permissible with aspiration precautions.  Code Status:  DNR Family Communication:  Met with both daughters, Tessie Fass and Adela Lank Disposition Plan: Expecting a hospital death.  Time spent:  30 min Elainah Rhyne L. Lovena Le, MD MBA The Palliative Medicine Team at [facility] Phone: [phone] Pager: [phone]

## 2013-05-25 NOTE — Discharge Summary (Signed)
Physician Discharge Summary  Faith Archssie B Tosi MVH:[id number] DOB: 04/27/1921 DOA: 05/22/2013  PCP: Josue HectorNYLAND,LEONARD ROBERT, MD  Admit date: 05/22/2013 Discharge date: 06/21/2013  Time spent: 40 minutes  Recommendations for Outpatient Follow-up:  1. Only as needed  Discharge Diagnoses:  Principal Problem:   Parapneumonic effusion Active Problems:   DM2 (diabetes mellitus, type 2)   Sepsis   HCAP (healthcare-associated pneumonia)   DNR (do not resuscitate)   Discharge Condition: Stable  Diet recommendation: Comfort  Filed Weights   05/23/13 0325 06/20/2013 [id number]  Weight: 62.3 kg (137 lb 5.6 oz) 58.3 kg (128 lb 8.5 oz)    History of present illness:  Faith Walker is a 80 y.o. female who presents to the ED with confusion and fever. HPI provided by daughters. Per daughters she has had gradually worsening confusion and developed fever today. All of this seems to have started with a fall on Friday. Mild congestion and cough starting yesterday. Per daughters she is always confused like she is now after taking sleeping meds which she did PTA.  Work up in ED demonstrates new opacification of the L hemithorax on CXR. CT chest shows this to be due to a large loculated pleural effusion on the left.  Hospital Course:  1. Parapneumonic effusion and HCAP resulting in sepsis  1. Full comfort measures per pt and family wishes 2. Greatly appreciate Palliative Care input 3. While awaiting placement, pt developed fevers and shallower breaths with intermittently agonal breathing 4. Pt has since been recommended for GIP 2. DM2 - holding diabetes meds and finger sticks per comfort care 3. HTN 4. Chronic pain  1. Pain med recs noted by Palliative Care noted and appreciated  Consultations:  Palliative Care  Discharge Exam: Filed Vitals:   05/23/13 1300 05/24/13 0601 05/24/13 1041 06/21/2013 0632  BP: 137/51 107/70 121/51 134/50  Pulse: 96 77  103  Temp: 98.2 F (36.8 C) 100.6 F (38.1 C)  101.7  F (38.7 C)  TempSrc: Oral Axillary  Axillary  Resp: 20 24  21   Height:      Weight:    58.3 kg (128 lb 8.5 oz)  SpO2: 97% 90%  94%    General: Asleep, appears comfortable Cardiovascular: perfused Respiratory: breathing comfortably, on supplemental [place]  Discharge Instructions     Medication List    STOP taking these medications       acetaminophen 500 MG tablet  Commonly known as:  TYLENOL  Replaced by:  acetaminophen 650 MG suppository     amLODipine 5 MG tablet  Commonly known as:  NORVASC     aspirin 81 MG chewable tablet     cyanocobalamin 1000 MCG/ML injection  Commonly known as:  (VITAMIN B-12)     diphenhydrAMINE 25 mg capsule  Commonly known as:  BENADRYL  Replaced by:  diphenhydrAMINE 50 MG/ML injection     glipiZIDE 2.5 MG 24 hr tablet  Commonly known as:  GLUCOTROL XL     glipiZIDE 5 MG 24 hr tablet  Commonly known as:  GLUCOTROL XL     hydrochlorothiazide 25 MG tablet  Commonly known as:  HYDRODIURIL     lovastatin 40 MG tablet  Commonly known as:  MEVACOR     meloxicam 7.5 MG tablet  Commonly known as:  MOBIC     multivitamin with minerals Tabs tablet     temazepam 30 MG capsule  Commonly known as:  RESTORIL     Vitamin D (Ergocalciferol) 50000 UNITS Caps capsule  Commonly  known as:  DRISDOL      TAKE these medications       acetaminophen 650 MG suppository  Commonly known as:  TYLENOL  Place 1 suppository (650 mg total) rectally every 6 (six) hours as needed for fever or mild pain.     ALPRAZolam 0.5 MG tablet  Commonly known as:  XANAX  Take 0.25 mg by mouth at bedtime as needed for anxiety.     antiseptic oral rinse Liqd  15 mLs by Mouth Rinse route 2 times daily at 12 noon and 4 pm.     chlorhexidine 0.12 % solution  Commonly known as:  PERIDEX  15 mLs by Mouth Rinse route 2 (two) times daily.     diphenhydrAMINE 50 MG/ML injection  Commonly known as:  BENADRYL  Inject 0.5 mLs (25 mg total) into the vein every 6 (six)  hours as needed for itching, allergies or sleep.     docusate sodium 100 MG capsule  Commonly known as:  COLACE  Take 100 mg by mouth 2 (two) times daily as needed for mild constipation.     erythromycin ophthalmic ointment  Place 1 application into both eyes at bedtime.     fluticasone 50 MCG/ACT nasal spray  Commonly known as:  FLONASE  Place 1 spray into both nostrils at bedtime.     gabapentin 100 MG capsule  Commonly known as:  NEURONTIN  Take 100 mg by mouth at bedtime as needed (for pain).     gabapentin 100 MG capsule  Commonly known as:  NEURONTIN  Take 200-300 mg by mouth 3 (three) times daily. Take 2 capsules every morning and at noon then take 3 capsules at bedtime     LORazepam 2 MG/ML injection  Commonly known as:  ATIVAN  Inject 0.25 mLs (0.5 mg total) into the vein every 4 (four) hours as needed for anxiety.     morphine CONCENTRATE 10 mg / 0.5 ml concentrated solution  Take 0.25 mLs (5 mg total) by mouth every 2 (two) hours as needed for severe pain or shortness of breath.     scopolamine 1 MG/3DAYS  Commonly known as:  TRANSDERM-SCOP  Place 1 patch (1.5 mg total) onto the skin every 3 (three) days.     SYSTANE 0.4-0.3 % Soln  Generic drug:  Polyethyl Glycol-Propyl Glycol  Place 1 drop into both eyes 4 (four) times daily.       No Known Allergies Follow-up Information   Follow up with Josue Hector, MD. (As needed)    Specialty:  Family Medicine   Contact information:   [address] [place] [address] [phone]        The results of significant diagnostics from this hospitalization (including imaging, microbiology, ancillary and laboratory) are listed below for reference.    Significant Diagnostic Studies: Ct Head Wo Contrast  05/23/2013   CLINICAL DATA:  Mental status change, history of diabetes, also patient is tachypneic and tachycardic  EXAM: CT HEAD WITHOUT CONTRAST  TECHNIQUE: Contiguous axial images were obtained from the  base of the skull through the vertex without intravenous contrast.  COMPARISON:  None.  FINDINGS: There is mild diffuse cerebral and cerebellar atrophy with compensatory ventriculomegaly. There is decreased density in the deep white matter of both cerebral hemispheres consistent with chronic small vessel ischemic type change. 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 There is no evidence of an acute intracranial hemorrhage nor of an evolving ischemic  infarction. The cerebellum and brainstem are normal in density.  At bone window settings the observed portions of the paranasal sinuses and mastoid air cells are clear. There is no evidence of an acute skull fracture.  IMPRESSION: There is no acute ischemic or hemorrhagic event within the brain. There are findings consistent with chronic small vessel ischemia. There is no intracranial mass effect or hydrocephalus.   Electronically Signed   By: David  [place]   On: 05/23/2013 00:34   Ct Chest Wo Contrast  05/23/2013   CLINICAL DATA:  New abnormal opacification of much of the left hemi thorax  EXAM: CT CHEST WITHOUT CONTRAST  TECHNIQUE: Multidetector CT imaging of the chest was performed following the standard protocol without IV contrast.  COMPARISON:  DG CHEST 1V PORT dated 05/22/2013  FINDINGS: There is a large left-sided pleural effusion which appears to be in part loculated. It exhibits Hounsfield measurement of +18. There is atelectatic lung adjacent to the effusion. There is a small amount of aerated left upper lobe remaining. There is shift of the mediastinum towards the right. There is a tiny right pleural effusion and there is right basilar atelectasis. The right upper and middle lobes are grossly clear. The cardiac chambers are mildly enlarged. The caliber of the thoracic aorta is normal. There are coronary artery calcifications present.  There is mild loss of height along the superior endplate of T6. There is compression  of the body of T11 with loss of height of at least 60% anteriorly. There is no definite retropulsion of bone. The observed portions of the ribs exhibit no acute abnormalities.  Within the upper abdomen the observed portions of the liver and spleen and adrenal glands appear normal.  IMPRESSION: 1. The new opacification of the left hemi thorax appears to be largely due to loculated pleural fluid on the left. There is also atelectatic lung. There is shift of the mediastinum toward the right. A discrete mass is not demonstrated but the noncontrast technique is insensitive in this regard. 2. There is a small right pleural effusion layering posteriorly and there is adjacent atelectasis. 3. There is mild enlargement of the cardiac chambers without pulmonary vascular congestion. 4. There are compression fractures of T6 and T11 with T11 exhibiting loss of height of at least 60% anteriorly.   Electronically Signed   By: David  [place]   On: 05/23/2013 01:22   Dg Chest Port 1 View  05/22/2013   CLINICAL DATA:  Left-sided chest pain, status post fall 1 week ago, now with shortness of breath.  EXAM: PORTABLE CHEST - 1 VIEW  COMPARISON:  DG CHEST 2V dated 11/10/2011  FINDINGS: Since the previous study there has developed marked volume loss on the left with a large amount of soft tissue density present. In there is mild shift of the mediastinum toward the right but there is pre-existing dextroscoliosis. The right lung is adequately inflated and clear. The cardiac silhouette is obscured. The observed portions of the left ribs appear intact. No acute bony abnormality elsewhere is demonstrated.  IMPRESSION: Since the previous study in of October 2013 there is developed volume loss on the left with only a small amount of remaining aerated lung. This may reflect the presence of a large new he pneumothorax or pleural effusion. Certainly an underlying mass is not excluded. Chest CT scanning now is recommended.  These results were  called by telephone at the time of interpretation on 05/22/2013 at 11:56 PM .   Electronically Signed  By: David  [place]   On: 05/22/2013 23:57    Microbiology: Recent Results (from the past 240 hour(s))  CULTURE, BLOOD (ROUTINE X 2)     Status: None   Collection Time    05/22/13 11:17 PM      Result Value Ref Range Status   Specimen Description BLOOD RIGHT ANTECUBITAL   Final   Special Requests     Final   Value: BOTTLES DRAWN AEROBIC AND ANAEROBIC AER 10CC ANA 9CC   Culture  Setup Time     Final   Value: 05/23/2013 03:32     Performed at [facility]   Culture     Final   Value:        BLOOD CULTURE RECEIVED NO GROWTH TO DATE CULTURE WILL BE HELD FOR 5 DAYS BEFORE ISSUING A FINAL NEGATIVE REPORT     Performed at [facility]   Report Status PENDING   Incomplete  CULTURE, BLOOD (ROUTINE X 2)     Status: None   Collection Time    05/22/13 11:27 PM      Result Value Ref Range Status   Specimen Description BLOOD ARM LEFT   Final   Special Requests BOTTLES DRAWN AEROBIC AND ANAEROBIC 10CC   Final   Culture  Setup Time     Final   Value: 05/23/2013 03:18     Performed at [facility]   Culture     Final   Value:        BLOOD CULTURE RECEIVED NO GROWTH TO DATE CULTURE WILL BE HELD FOR 5 DAYS BEFORE ISSUING A FINAL NEGATIVE REPORT     Performed at [facility]   Report Status PENDING   Incomplete  URINE CULTURE     Status: None   Collection Time    05/22/13 11:42 PM      Result Value Ref Range Status   Specimen Description URINE, CATHETERIZED   Final   Special Requests NONE   Final   Culture  Setup Time     Final   Value: 05/23/2013 03:46     Performed at [facility]   Colony Count     Final   Value: NO GROWTH     Performed at [facility]   Culture     Final   Value: NO GROWTH     Performed at [facility]   Report Status 05/23/2013 FINAL   Final     Labs: Basic Metabolic Panel:  Recent Labs Lab 05/22/13 2317   NA 128*  K 4.9  CL 94*  CO2 22  GLUCOSE 402*  BUN 35*  CREATININE 1.37*  CALCIUM 8.2*   Liver Function Tests: No results found for this basename: AST, ALT, ALKPHOS, BILITOT, PROT, ALBUMIN,  in the last 168 hours No results found for this basename: LIPASE, AMYLASE,  in the last 168 hours No results found for this basename: AMMONIA,  in the last 168 hours CBC:  Recent Labs Lab 05/22/13 2317  WBC 21.5*  NEUTROABS 19.3*  HGB 10.6*  HCT 31.9*  MCV 91.1  PLT 213   Cardiac Enzymes: No results found for this basename: CKTOTAL, CKMB, CKMBINDEX, TROPONINI,  in the last 168 hours BNP: BNP (last 3 results) No results found for this basename: PROBNP,  in the last 8760 hours CBG:  Recent Labs Lab 05/22/13 2257  GLUCAP 406*       Signed:  Jerald KiefStephen K Lily Velasquez  [facility] 06/11/2013,  1:45 PM

## 2013-05-25 NOTE — Clinical Social Work Note (Addendum)
Per [facility] lia[facility], no bed availability for today or over weekend. CSW consulted with MD regarding possible GIP due to pt's current medical state. CSW has notified covering RNCM of possible GIP. CSW to continue to follow and assist with disposition, if needed.  CSW spoke with admissions lia[facility] Per admissions liaison, [facility] (and through weekend) IF pt is stable for transfer. CSW continuing to follow and assist with disposition.  Darlyn ChamberEmily [place], LCSWA Clinical Social Worker [phone]

## 2013-05-25 NOTE — Progress Notes (Signed)
[facility] PROGRESS NOTE  Faith Walker [id number] DOB: 07/05/1921 DOA: 05/22/2013 PCP: Josue HectorNYLAND,LEONARD ROBERT, MD  Assessment/Plan: 1. Parapneumonic effusion and HCAP resulting in sepsis 1. Cont full comfort measures currently. 2. Febrile this AM 3. Greatly appreciate Palliative Care input 4. [facility] 5. As pt is now febrile and therefore presumably decompensating, would she be a GIP candidate? 2. DM2 - holding diabetes meds and finger sticks per comfort care 3. HTN 4. Chronic pain 1. Pain med recs noted by Palliative Care noted and appreciated  Code Status: DNR/Comfort Family Communication: Pt in room (indicate person spoken with, relationship, and if by phone, the number) Disposition Plan: Pending   Consultants:  Palliative Care  HPI/Subjective: No acute events noted. Pt appears comfortable  Objective: Filed Vitals:   05/23/13 1300 05/24/13 0601 05/24/13 1041 27-Feb-2013 0632  BP: 137/51 107/70 121/51 134/50  Pulse: 96 77  103  Temp: 98.2 F (36.8 C) 100.6 F (38.1 C)  101.7 F (38.7 C)  TempSrc: Oral Axillary  Axillary  Resp: 20 24  21   Height:      Weight:    58.3 kg (128 lb 8.5 oz)  SpO2: 97% 90%  94%    Intake/Output Summary (Last 24 hours) at 27-Feb-2013 0913 Last data filed at 27-Feb-2013 0500  Gross per 24 hour  Intake     40 ml  Output    300 ml  Net   -260 ml   Filed Weights   05/23/13 0325 27-Feb-2013 [id number]  Weight: 62.3 kg (137 lb 5.6 oz) 58.3 kg (128 lb 8.5 oz)    Exam:  General:  Asleep, arousable  Cardiovascular: regular, perfused  Respiratory: normal resp effort, no audible wheezing  Abdomen: nondistended  Musculoskeletal: perfused, no clubbing   Data Reviewed: Basic Metabolic Panel:  Recent Labs Lab 05/22/13 2317  NA 128*  K 4.9  CL 94*  CO2 22  GLUCOSE 402*  BUN 35*  CREATININE 1.37*  CALCIUM 8.2*   Liver Function Tests: No results found for this basename: AST, ALT, ALKPHOS, BILITOT, PROT,  ALBUMIN,  in the last 168 hours No results found for this basename: LIPASE, AMYLASE,  in the last 168 hours No results found for this basename: AMMONIA,  in the last 168 hours CBC:  Recent Labs Lab 05/22/13 2317  WBC 21.5*  NEUTROABS 19.3*  HGB 10.6*  HCT 31.9*  MCV 91.1  PLT 213   Cardiac Enzymes: No results found for this basename: CKTOTAL, CKMB, CKMBINDEX, TROPONINI,  in the last 168 hours BNP (last 3 results) No results found for this basename: PROBNP,  in the last 8760 hours CBG:  Recent Labs Lab 05/22/13 2257  GLUCAP 406*    Recent Results (from the past 240 hour(s))  CULTURE, BLOOD (ROUTINE X 2)     Status: None   Collection Time    05/22/13 11:17 PM      Result Value Ref Range Status   Specimen Description BLOOD RIGHT ANTECUBITAL   Final   Special Requests     Final   Value: BOTTLES DRAWN AEROBIC AND ANAEROBIC AER 10CC ANA 9CC   Culture  Setup Time     Final   Value: 05/23/2013 03:32     Performed at [facility]   Culture     Final   Value:        BLOOD CULTURE RECEIVED NO GROWTH TO DATE CULTURE WILL BE HELD FOR 5 DAYS BEFORE ISSUING A FINAL NEGATIVE REPORT  Performed at [facility]   Report Status PENDING   Incomplete  CULTURE, BLOOD (ROUTINE X 2)     Status: None   Collection Time    05/22/13 11:27 PM      Result Value Ref Range Status   Specimen Description BLOOD ARM LEFT   Final   Special Requests BOTTLES DRAWN AEROBIC AND ANAEROBIC 10CC   Final   Culture  Setup Time     Final   Value: 05/23/2013 03:18     Performed at [facility]   Culture     Final   Value:        BLOOD CULTURE RECEIVED NO GROWTH TO DATE CULTURE WILL BE HELD FOR 5 DAYS BEFORE ISSUING A FINAL NEGATIVE REPORT     Performed at [facility]   Report Status PENDING   Incomplete  URINE CULTURE     Status: None   Collection Time    05/22/13 11:42 PM      Result Value Ref Range Status   Specimen Description URINE, CATHETERIZED   Final   Special  Requests NONE   Final   Culture  Setup Time     Final   Value: 05/23/2013 03:46     Performed at [facility]   Colony Count     Final   Value: NO GROWTH     Performed at [facility]   Culture     Final   Value: NO GROWTH     Performed at [facility]   Report Status 05/23/2013 FINAL   Final     Studies: No results found.  Scheduled Meds: . amLODipine  5 mg Oral Daily  . antiseptic oral rinse  15 mL Mouth Rinse q12n4p  . aspirin  81 mg Oral Daily  . chlorhexidine  15 mL Mouth Rinse BID  . erythromycin  1 application Both Eyes QHS  . fluticasone  1 spray Each Nare QHS  . gabapentin  200 mg Oral 2 times per day  . gabapentin  300 mg Oral QHS  . meloxicam  7.5 mg Oral BID  . polyvinyl alcohol  1 drop Both Eyes QID  . scopolamine  1 patch Transdermal Q72H  . temazepam  30 mg Oral QHS   Continuous Infusions:   Principal Problem:   Parapneumonic effusion Active Problems:   DM2 (diabetes mellitus, type 2)   Sepsis   HCAP (healthcare-associated pneumonia)   DNR (do not resuscitate)  Time spent: 35min  Jerald KiefStephen K Chiu  [facility] Pager [phone]. If 7PM-7AM, please contact night-coverage at [email or web address], password [email or web address] 05/31/2013, 9:13 AM  LOS: 3 days

## 2013-05-25 NOTE — Progress Notes (Signed)
[facility] Rm 6N15, E. Coralie Keens, [place] [facility] RN Visit- M. Wynetta Emery, RN  Received request to evaluate for GIP level of care; Attending and PMT physicians noted pt's sepsis progressing; Pt information reviewed with [facility] physician Dr Tomasa Hosteller; eligibility confirmed  GIP related admission to [facility] diagnosis of Para-pneumonic effusion (511.89) pt is a DNR code status   Pt seen at bedside asleep, very warm to touch Temp 101.7 (axillary); mouth breathing, slack jaw, did awake to voice, spoke briefly with one/two word answers to daughters, pt was able to take an ice chip to moisten her mouth, tolerated well; audible upper airway secretions noted with irregular breathing pattern occasional 5 sec period of apnea; daughters Freda Munro and Patsy at bedside, voice they have noted a change in mother's condition- they asked questions about changes in breathing and ongoing fever- discussed EOL s/sx encouraged daughters to notify staff RN with any concerns or changes; discussed current medications in place for comfort; pt has required 2 doses of 5 mg Morphine SL for restlessness and discussed with staff RN using Tylenol PR for fever daughters are using cool compresses to her forehead - on-going skilled nursing assessment for non-verbal s/sx of pain/discomfort and effectiveness of symptom management      Daughters aware [facility] team will continue to follow daily with PMT attending Dr Lovena Le [facility] Erling Conte has met with daughters to complete registration visit. Please call [facility] @ [phone]-  with any hospice needs.   Thank you.  Danton Sewer, RN  [facility]  Hospice Liaison

## 2013-05-29 LAB — CULTURE, BLOOD (ROUTINE X 2)
Culture: NO GROWTH
Culture: NO GROWTH

## 2013-06-25 NOTE — Discharge Summary (Signed)
Death Summary  Faith Walker [id number] DOB: 07/06/1921 DOA: 06/12/2013  PCP: Josue HectorNYLAND,LEONARD ROBERT, MD PCP/Office notified: pre alerted office.  Admit date: 06/20/2013 Date of Death: 06/14/2013  Final Diagnoses:  Active Problems:   Parapneumonic effusion Sepsis Parapneumonic Effusion Respiratory failure   History of present illness:  80 yr old female admitted with fever, altered mental status and shortness of breath.  Diagnosed with pneumonia and large parapneumonic effusion.  Family elected comfort care and patient was converted to Hospice GIP on 5/1.  She was treated with comfort measures for dyspnea, fever and pain.   Hospital Course:  Patient had a wonderful day on 4//30 visiting with family and saying her goodbyes. On 5/1 she was sleeping most of the day and refused most po intake.  She required minimal pain medication for pain and breathing.  At 431 am on 5/2 patient was found by staff to be without respirations or heart beat and was declared deceased.   Updated hospice social worker on time of death. Time:  431 am  Signed:  Milania Haubner L. Ladona Ridgelaylor, MD MBA The Palliative Medicine Team at [facility] Team Phone: [phone] Pager: [phone]

## 2013-06-25 DEATH — deceased

## 2015-03-14 IMAGING — CT CT HEAD W/O CM
2 series · 16 of 30 positions shown, 18 images · non-contrast
Comparison: None.

CLINICAL DATA: Mental status change, history of diabetes, also
patient is tachypneic and tachycardic

EXAM:
CT HEAD WITHOUT CONTRAST
TECHNIQUE: Contiguous axial images were obtained from the base of the skull
through the vertex without intravenous contrast.

[Series 201: head w/o, idose (1) · axial · non-contrast · 0.42mm/px · z∈[+97,+217]mm · 8 of 32 slices shown, 10 images]
[im 4/32  brain]
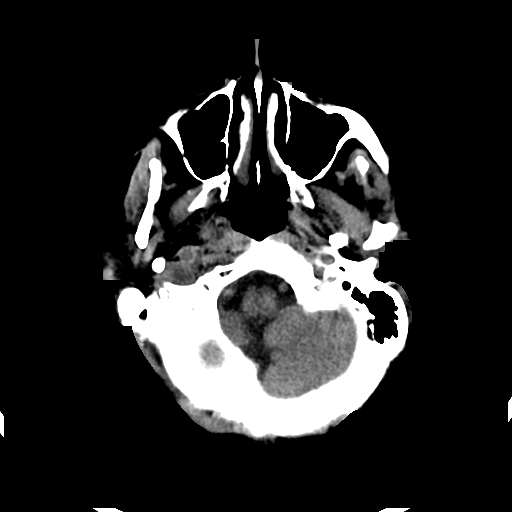
[im 4/32  bone]
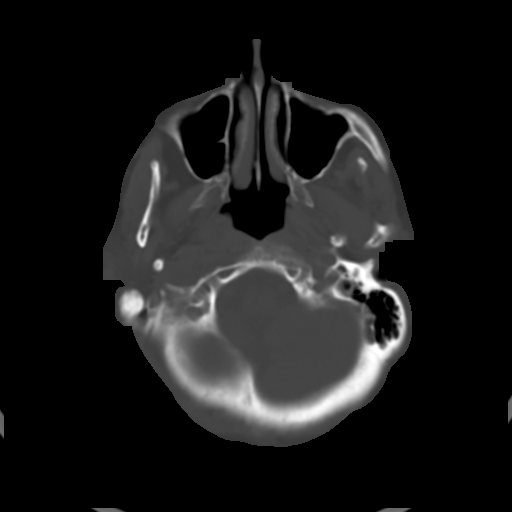
[im 7/32  brain]
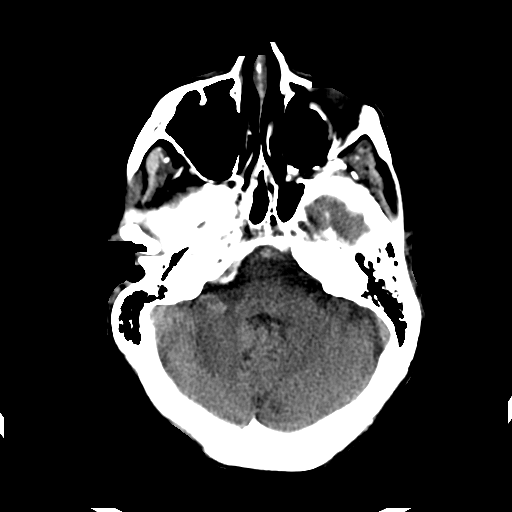
[im 11/32  brain]
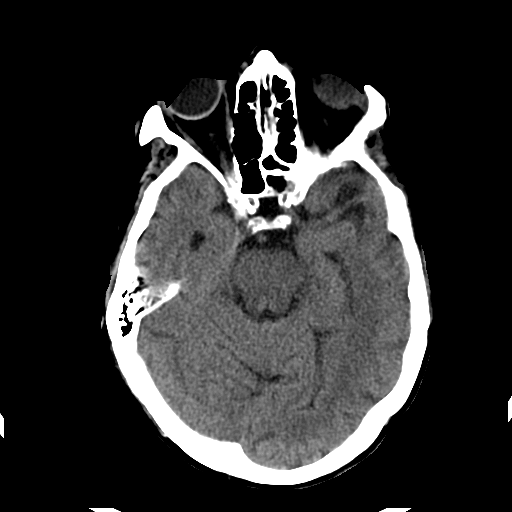
[im 14/32  brain]
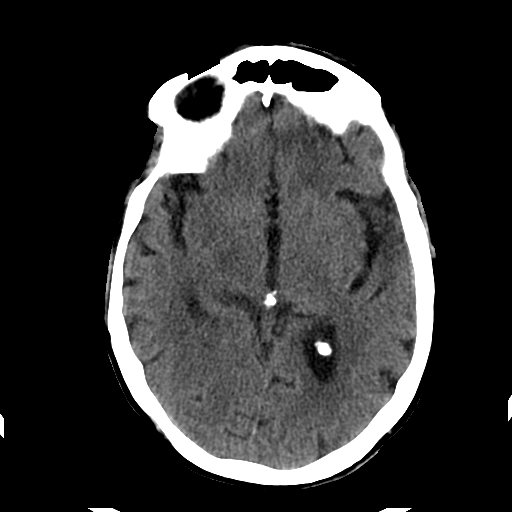
[im 18/32  brain]
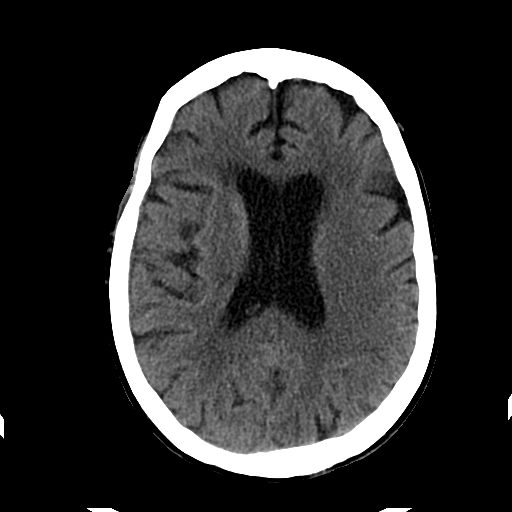
[im 18/32  bone]
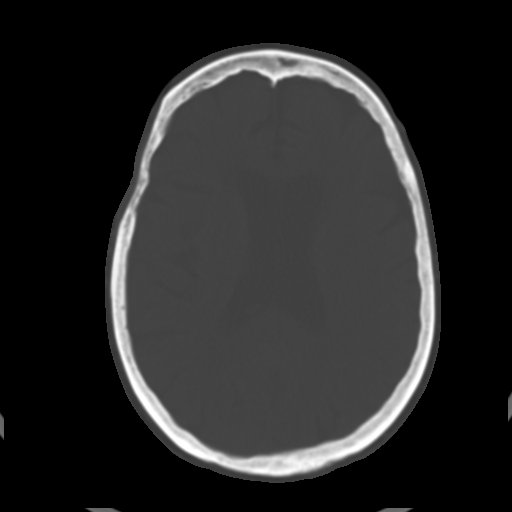
[im 21/32  brain]
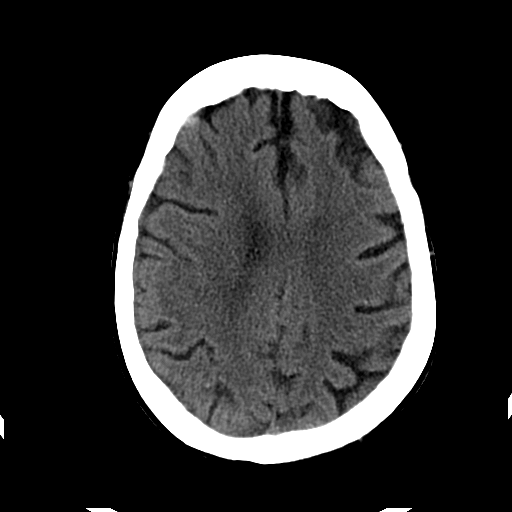
[im 25/32  brain]
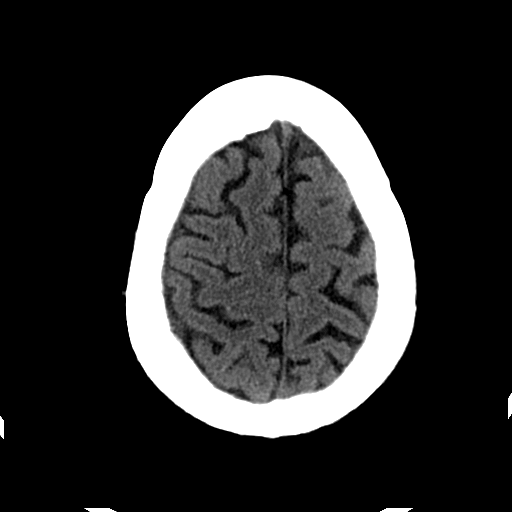
[im 28/32  brain]
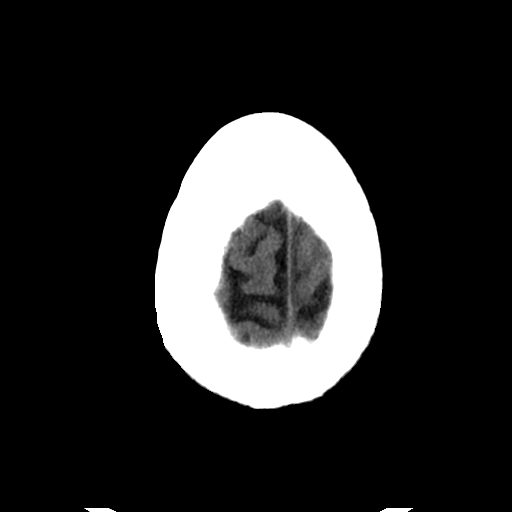

[Series 202: head w/o bone, idose (1) · axial · non-contrast · 0.42mm/px · z∈[+96,+221]mm · 8 of 64 slices shown]
[im 7/64  bone]
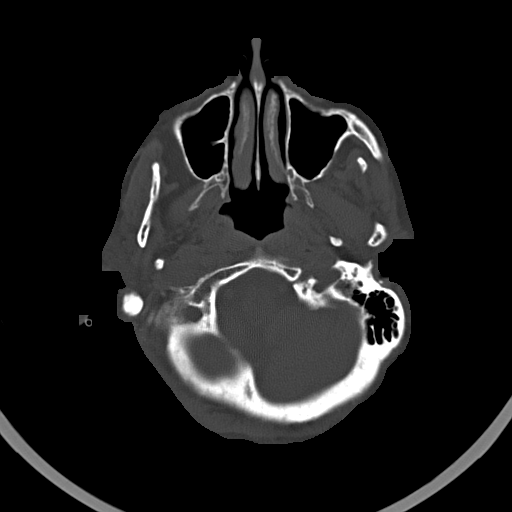
[im 14/64  bone]
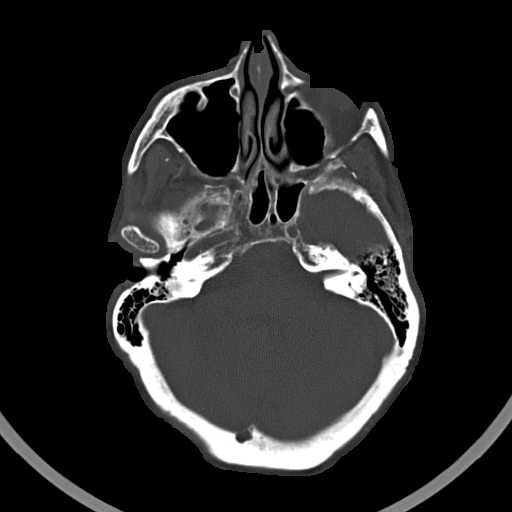
[im 20/64  bone]
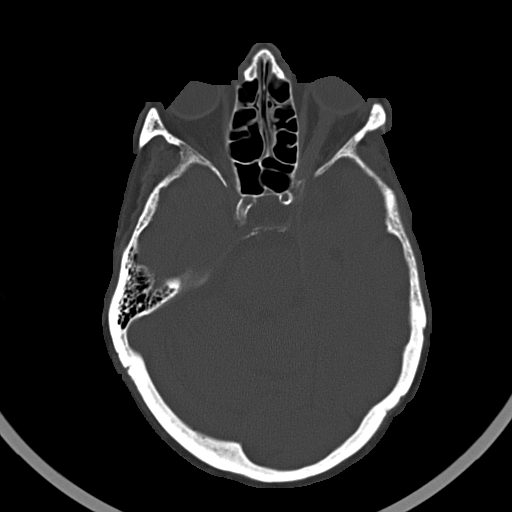
[im 27/64  bone]
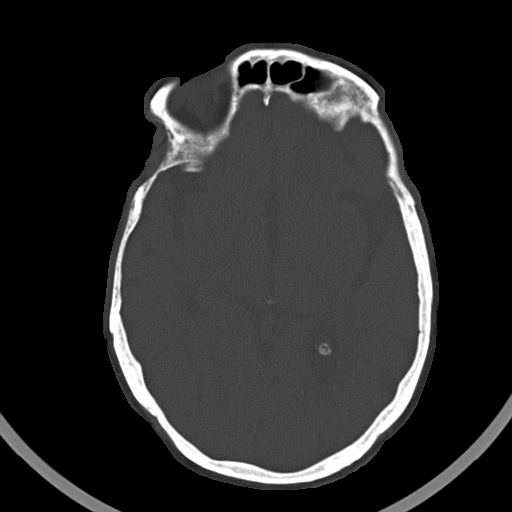
[im 37/64  bone]
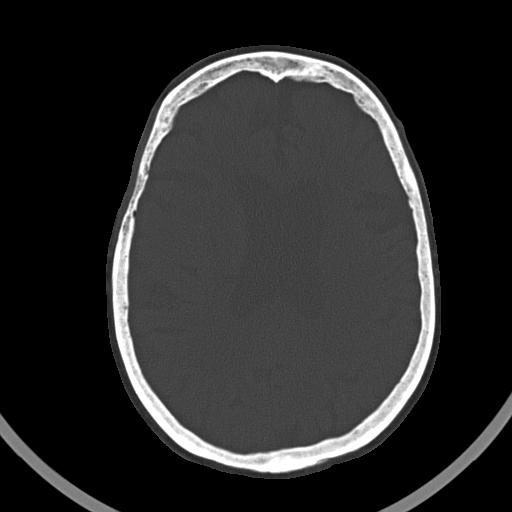
[im 44/64  bone]
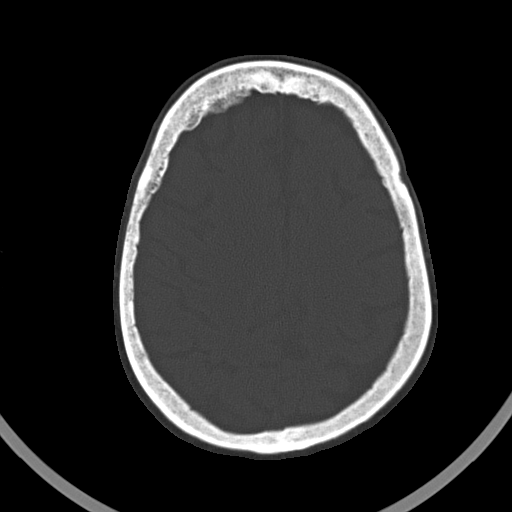
[im 50/64  bone]
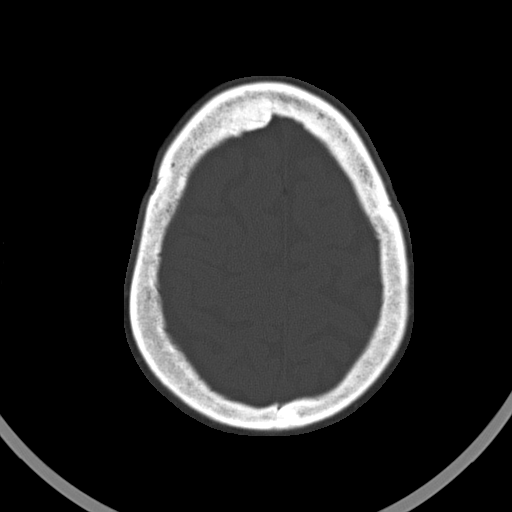
[im 57/64  bone]
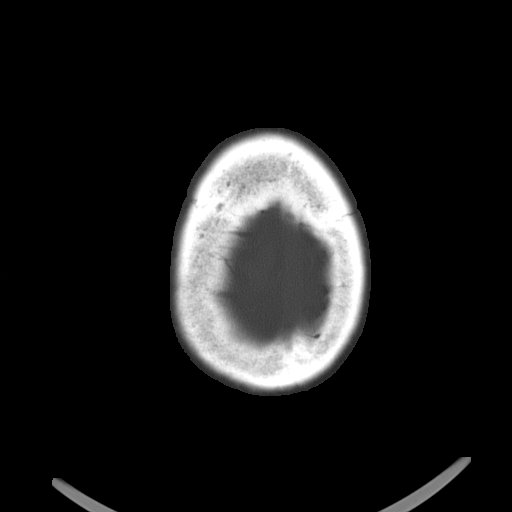

[16 of 30 positions shown; findings below may reference images not displayed]

FINDINGS: There is mild diffuse cerebral and cerebellar atrophy with
compensatory ventriculomegaly. There is decreased density in the
deep white matter of both cerebral hemispheres consistent with
chronic small vessel ischemic type change. There are old subset of
lacunar infarctions in the corpus callosum on the right. There is no
evidence of an acute intracranial hemorrhage nor of an evolving
ischemic infarction. The cerebellum and brainstem are normal in
density.

At bone window settings the observed portions of the paranasal
sinuses and mastoid air cells are clear. There is no evidence of an
acute skull fracture.
IMPRESSION: There is no acute ischemic or hemorrhagic event within the brain.
There are findings consistent with chronic small vessel ischemia.
There is no intracranial mass effect or hydrocephalus.
